# Patient Record
Sex: Female | Born: 1955
Health system: Southern US, Community
[De-identification: ages and names within clinical notes are randomized; demographics above are authoritative.]

## PROBLEM LIST (undated history)

## (undated) DIAGNOSIS — D219 Benign neoplasm of connective and other soft tissue, unspecified: Secondary | ICD-10-CM

## (undated) DIAGNOSIS — R87619 Unspecified abnormal cytological findings in specimens from cervix uteri: Secondary | ICD-10-CM

## (undated) DIAGNOSIS — M1990 Unspecified osteoarthritis, unspecified site: Secondary | ICD-10-CM

## (undated) DIAGNOSIS — M542 Cervicalgia: Secondary | ICD-10-CM

## (undated) DIAGNOSIS — T7840XA Allergy, unspecified, initial encounter: Secondary | ICD-10-CM

## (undated) DIAGNOSIS — E669 Obesity, unspecified: Secondary | ICD-10-CM

## (undated) DIAGNOSIS — R0683 Snoring: Secondary | ICD-10-CM

## (undated) DIAGNOSIS — K219 Gastro-esophageal reflux disease without esophagitis: Secondary | ICD-10-CM

## (undated) DIAGNOSIS — M549 Dorsalgia, unspecified: Secondary | ICD-10-CM

## (undated) DIAGNOSIS — Z973 Presence of spectacles and contact lenses: Secondary | ICD-10-CM

## (undated) HISTORY — DX: Cervicalgia: M54.2

## (undated) HISTORY — DX: Gastro-esophageal reflux disease without esophagitis: K21.9

## (undated) HISTORY — PX: COLONOSCOPY: SHX174

## (undated) HISTORY — PX: BREAST CYST EXCISION: SHX579

## (undated) HISTORY — DX: Obesity, unspecified: E66.9

## (undated) HISTORY — PX: BREAST BIOPSY: SHX20

## (undated) HISTORY — DX: Unspecified osteoarthritis, unspecified site: M19.90

## (undated) HISTORY — DX: Benign neoplasm of connective and other soft tissue, unspecified: D21.9

## (undated) HISTORY — PX: REDUCTION MAMMAPLASTY: SUR839

## (undated) HISTORY — DX: Unspecified abnormal cytological findings in specimens from cervix uteri: R87.619

## (undated) HISTORY — DX: Allergy, unspecified, initial encounter: T78.40XA

---

## 1974-04-25 HISTORY — PX: CHOLECYSTECTOMY: SHX55

## 2001-07-27 ENCOUNTER — Ambulatory Visit (HOSPITAL_COMMUNITY): Admission: RE | Admit: 2001-07-27 | Discharge: 2001-07-27 | Payer: Self-pay | Admitting: Family Medicine

## 2001-07-27 ENCOUNTER — Encounter: Payer: Self-pay | Admitting: Family Medicine

## 2001-10-01 ENCOUNTER — Ambulatory Visit (HOSPITAL_COMMUNITY): Admission: RE | Admit: 2001-10-01 | Discharge: 2001-10-01 | Payer: Self-pay | Admitting: Unknown Physician Specialty

## 2001-10-01 ENCOUNTER — Encounter: Payer: Self-pay | Admitting: Unknown Physician Specialty

## 2001-12-07 ENCOUNTER — Ambulatory Visit (HOSPITAL_COMMUNITY): Admission: RE | Admit: 2001-12-07 | Discharge: 2001-12-07 | Payer: Self-pay | Admitting: Unknown Physician Specialty

## 2001-12-07 ENCOUNTER — Encounter: Payer: Self-pay | Admitting: Unknown Physician Specialty

## 2001-12-22 ENCOUNTER — Emergency Department (HOSPITAL_COMMUNITY): Admission: EM | Admit: 2001-12-22 | Discharge: 2001-12-22 | Payer: Self-pay | Admitting: Internal Medicine

## 2002-01-07 ENCOUNTER — Ambulatory Visit (HOSPITAL_COMMUNITY): Admission: RE | Admit: 2002-01-07 | Discharge: 2002-01-07 | Payer: Self-pay | Admitting: Unknown Physician Specialty

## 2002-01-07 ENCOUNTER — Encounter: Payer: Self-pay | Admitting: Unknown Physician Specialty

## 2002-07-31 ENCOUNTER — Ambulatory Visit (HOSPITAL_COMMUNITY): Admission: RE | Admit: 2002-07-31 | Discharge: 2002-07-31 | Payer: Self-pay | Admitting: Family Medicine

## 2002-07-31 ENCOUNTER — Encounter: Payer: Self-pay | Admitting: Family Medicine

## 2003-04-22 ENCOUNTER — Ambulatory Visit (HOSPITAL_COMMUNITY): Admission: RE | Admit: 2003-04-22 | Discharge: 2003-04-22 | Payer: Self-pay | Admitting: Family Medicine

## 2003-11-20 ENCOUNTER — Ambulatory Visit (HOSPITAL_COMMUNITY): Admission: RE | Admit: 2003-11-20 | Discharge: 2003-11-20 | Payer: Self-pay | Admitting: Internal Medicine

## 2004-02-16 ENCOUNTER — Ambulatory Visit (HOSPITAL_COMMUNITY): Admission: RE | Admit: 2004-02-16 | Discharge: 2004-02-16 | Payer: Self-pay | Admitting: Obstetrics and Gynecology

## 2004-04-29 ENCOUNTER — Ambulatory Visit: Payer: Self-pay | Admitting: Family Medicine

## 2004-05-21 ENCOUNTER — Emergency Department (HOSPITAL_COMMUNITY): Admission: EM | Admit: 2004-05-21 | Discharge: 2004-05-21 | Payer: Self-pay | Admitting: Emergency Medicine

## 2005-01-30 ENCOUNTER — Emergency Department (HOSPITAL_COMMUNITY): Admission: EM | Admit: 2005-01-30 | Discharge: 2005-01-30 | Payer: Self-pay | Admitting: Emergency Medicine

## 2005-11-17 ENCOUNTER — Ambulatory Visit: Payer: Self-pay | Admitting: Obstetrics & Gynecology

## 2005-11-17 LAB — CONVERTED CEMR LAB: Pap Smear: NORMAL

## 2005-11-21 ENCOUNTER — Ambulatory Visit (HOSPITAL_COMMUNITY): Admission: RE | Admit: 2005-11-21 | Discharge: 2005-11-21 | Payer: Self-pay | Admitting: Family Medicine

## 2007-01-02 ENCOUNTER — Ambulatory Visit (HOSPITAL_COMMUNITY): Admission: RE | Admit: 2007-01-02 | Discharge: 2007-01-02 | Payer: Self-pay | Admitting: Emergency Medicine

## 2007-02-21 LAB — HM SIGMOIDOSCOPY: HM Sigmoidoscopy: NORMAL

## 2007-02-21 LAB — HM COLONOSCOPY: HM Colonoscopy: NORMAL

## 2007-06-14 ENCOUNTER — Encounter: Admission: RE | Admit: 2007-06-14 | Discharge: 2007-06-14 | Payer: Self-pay | Admitting: Emergency Medicine

## 2008-01-03 ENCOUNTER — Ambulatory Visit (HOSPITAL_COMMUNITY): Admission: RE | Admit: 2008-01-03 | Discharge: 2008-01-03 | Payer: Self-pay | Admitting: Emergency Medicine

## 2009-01-21 ENCOUNTER — Ambulatory Visit: Payer: Self-pay | Admitting: Family Medicine

## 2009-01-21 DIAGNOSIS — N950 Postmenopausal bleeding: Secondary | ICD-10-CM | POA: Insufficient documentation

## 2009-01-21 DIAGNOSIS — D259 Leiomyoma of uterus, unspecified: Secondary | ICD-10-CM | POA: Insufficient documentation

## 2009-01-21 DIAGNOSIS — R87619 Unspecified abnormal cytological findings in specimens from cervix uteri: Secondary | ICD-10-CM | POA: Insufficient documentation

## 2009-01-23 ENCOUNTER — Encounter: Payer: Self-pay | Admitting: Family Medicine

## 2009-01-23 LAB — CONVERTED CEMR LAB
BUN: 10 mg/dL (ref 6–23)
Basophils Absolute: 0 10*3/uL (ref 0.0–0.1)
Basophils Relative: 0 % (ref 0–1)
CO2: 23 meq/L (ref 19–32)
Calcium: 9.3 mg/dL (ref 8.4–10.5)
Chloride: 106 meq/L (ref 96–112)
Cholesterol: 176 mg/dL (ref 0–200)
Creatinine, Ser: 0.88 mg/dL (ref 0.40–1.20)
Eosinophils Absolute: 0.1 10*3/uL (ref 0.0–0.7)
Eosinophils Relative: 2 % (ref 0–5)
Glucose, Bld: 88 mg/dL (ref 70–99)
HCT: 40.4 % (ref 36.0–46.0)
HDL: 58 mg/dL (ref >39)
Hemoglobin: 12.9 g/dL (ref 12.0–15.0)
LDL Cholesterol: 102 mg/dL — ABNORMAL HIGH (ref 0–99)
Lymphocytes Relative: 42 % (ref 12–46)
Lymphs Abs: 2.1 10*3/uL (ref 0.7–4.0)
MCHC: 31.9 g/dL (ref 30.0–36.0)
MCV: 92.2 fL (ref 78.0–100.0)
Monocytes Absolute: 0.4 10*3/uL (ref 0.1–1.0)
Monocytes Relative: 7 % (ref 3–12)
Neutro Abs: 2.4 10*3/uL (ref 1.7–7.7)
Neutrophils Relative %: 48 % (ref 43–77)
Platelets: 260 10*3/uL (ref 150–400)
Potassium: 4.5 meq/L (ref 3.5–5.3)
RBC: 4.38 M/uL (ref 3.87–5.11)
RDW: 14.1 % (ref 11.5–15.5)
Sodium: 143 meq/L (ref 135–145)
Total CHOL/HDL Ratio: 3
Triglycerides: 78 mg/dL (ref <150)
VLDL: 16 mg/dL (ref 0–40)
WBC: 5 10*3/uL (ref 4.0–10.5)

## 2009-01-25 DIAGNOSIS — J309 Allergic rhinitis, unspecified: Secondary | ICD-10-CM | POA: Insufficient documentation

## 2009-01-26 ENCOUNTER — Encounter: Payer: Self-pay | Admitting: Family Medicine

## 2009-01-27 ENCOUNTER — Encounter: Payer: Self-pay | Admitting: Family Medicine

## 2009-01-27 LAB — CONVERTED CEMR LAB: TSH: 1.657 microintl units/mL (ref 0.350–4.500)

## 2009-02-27 ENCOUNTER — Emergency Department (HOSPITAL_COMMUNITY): Admission: EM | Admit: 2009-02-27 | Discharge: 2009-02-28 | Payer: Self-pay | Admitting: Emergency Medicine

## 2010-03-05 ENCOUNTER — Other Ambulatory Visit: Admission: RE | Admit: 2010-03-05 | Discharge: 2010-03-05 | Payer: Self-pay | Admitting: Family Medicine

## 2010-03-23 ENCOUNTER — Encounter: Admission: RE | Admit: 2010-03-23 | Discharge: 2010-03-23 | Payer: Self-pay | Admitting: Family Medicine

## 2010-05-16 ENCOUNTER — Encounter: Payer: Self-pay | Admitting: Family Medicine

## 2010-05-27 NOTE — Progress Notes (Signed)
Summary: OBGYN  OBGYN   Imported By: Lind Guest 01/27/2009 08:14:09  _____________________________________________________________________  External Attachment:    Type:   Image     Comment:   External Document

## 2010-07-19 ENCOUNTER — Ambulatory Visit (INDEPENDENT_AMBULATORY_CARE_PROVIDER_SITE_OTHER): Payer: BC Managed Care – PPO | Admitting: Internal Medicine

## 2010-07-19 ENCOUNTER — Encounter: Payer: Self-pay | Admitting: Internal Medicine

## 2010-07-19 VITALS — BP 110/72 | HR 76 | Ht 67.0 in | Wt 234.6 lb

## 2010-07-19 DIAGNOSIS — J209 Acute bronchitis, unspecified: Secondary | ICD-10-CM | POA: Insufficient documentation

## 2010-07-19 DIAGNOSIS — J309 Allergic rhinitis, unspecified: Secondary | ICD-10-CM

## 2010-07-19 MED ORDER — ALBUTEROL SULFATE HFA 108 (90 BASE) MCG/ACT IN AERS: 2.0000 | INHALATION_SPRAY | Freq: Four times a day (QID) | RESPIRATORY_TRACT | Status: DC | PRN

## 2010-07-19 MED ORDER — BECLOMETHASONE DIPROPIONATE 80 MCG/ACT IN AERS: 2.0000 | INHALATION_SPRAY | Freq: Two times a day (BID) | RESPIRATORY_TRACT | Status: DC

## 2010-07-19 MED ORDER — FLUTICASONE PROPIONATE 50 MCG/ACT NA SUSP: 2.0000 | Freq: Every day | NASAL | Status: DC

## 2010-07-19 NOTE — Assessment & Plan Note (Signed)
Mild interrmittent seasonal allergy.

## 2010-07-19 NOTE — Progress Notes (Signed)
Subjective:    Patient ID: Stephanie Wall, female    DOB: 01/15/1956, 55 y.o.   MRN: [id number] New Patient Visit- Asthma She complains of shortness of breath. Her past medical history is significant for asthma.  Shortness of Breath Her past medical history is significant for asthma.  HPI- 55 yoF never smoker self referred requesting pulmonary evaluation for recent dyspnea. Had allergic seasonal rhinitis in childhood. While in her 20's she would get an "allergy shot"- presume steroid- for Nasal congestion in Fall season. Again in last 2 years she has noted Spring and Fall sneezing and head congestion. In 2010 she went to ER with an asthmatic bronchitis syndrome after exposure to carpet cleaner. She has occasionally noted a little wheeze lying supine. Now in the last 3 weeks she has noted frontal headache, shortness of breath, wheeze, light headed, much cough, some nausea w/ vomiting or change in bowel/ bladder, no fever, purulent or swollen glands. Denies bleeding or weight loss She was walking regularly without dyspnea "till pollen", but says for past year she has noted more dyspnea on hills.  Denies hx diagnosed heart or lung disease.  Today she feels well.    Review of Systems  Respiratory: Positive for shortness of breath.   Constitutional:   No weight loss, night sweats,  Fevers, chills, fatigue, lassitude. HEENT:   Denies-,  Difficulty swallowing,  Tooth/dental problems,  Sore throat,               CV:  No chest pain,  Orthopnea, PND, swelling in lower extremities, anasarca, dizziness, palpitations  GI  No heartburn, occasional indigestion, abdominal pain, nausea, vomiting, diarrhea, change in bowel habits, loss of appetite  Resp: No shortness of breath with exertion or at rest.  No excess mucus, no productive cough,  No non-productive cough,  No coughing up of blood.  No change in color of mucus.  No wheezing.  No chest wall deformity  Skin: no rash or lesions.  GU: no dysuria,  change in color of urine, no urgency or frequency.  No flank pain.  MS:  No joint pain or swelling.  No decreased range of motion.  No back pain.  Psych:  No change in mood or affect. No depression or anxiety.  No memory loss.      Objective:   Physical Exam    General- Alert, Oriented, Affect-appropriate, Distress- none acute  Skin- rash-none, lesions- none, excoriation- none  Lymphadenopathy- none  Head- atraumatic  Eyes- Gross vision intact, PERRLA, conjunctivae clear, secretions  Ears- Normal- Hearing, canals, Tm L ,   R ,  Nose- Clear, No- Septal dev, mucus, polyps, erosion, perforation   Throat- Mallampati II , mucosa clear , drainage- none, tonsils- atrophic  Neck- flexible , trachea midline, no stridor , thyroid nl, carotid no bruit  Chest - symmetrical excursion , unlabored     Heart/CV- RRR , no murmur , no gallop  , no rub, nl s1 s2                     - JVD- none , edema- none, stasis changes- none, varices- none     Lung- clear to P&A, wheeze- none, cough- none , dullness-none, rub- none     Chest wall-  Abd- tender-no, distended-no, bowel sounds-present, HSM- no  Br/ Gen/ Rectal- Not done, not indicated  Extrem- cyanosis- none, clubbing, none, atrophy- none, strength- nl  Neuro- grossly intact to observation     Assessment &  Plan:

## 2010-07-19 NOTE — Assessment & Plan Note (Addendum)
Acute URI with bronchitis syndrome in late February/ early March, 2012.  This timing is nonspecific, but would first question a viral infection rather than pollen allergy alone, although she could have had both. She has had occasional CXR. We will get PFT for baseline, since she currently feels well.

## 2010-07-19 NOTE — Patient Instructions (Addendum)
Order- Schedule PFT  Meds-  Qvar 80- maintenance controller. 2 puffs and rinse mouth, twice every day. You can try to stay off of this if you are doing really well.   Ventolin HFA- rescue bronchodilator inhaler- 2 puffs, up to 4 times daily when needed.

## 2010-07-23 ENCOUNTER — Ambulatory Visit (INDEPENDENT_AMBULATORY_CARE_PROVIDER_SITE_OTHER): Payer: BC Managed Care – PPO | Admitting: Internal Medicine

## 2010-07-23 DIAGNOSIS — R0989 Other specified symptoms and signs involving the circulatory and respiratory systems: Secondary | ICD-10-CM

## 2010-07-23 DIAGNOSIS — R0600 Dyspnea, unspecified: Secondary | ICD-10-CM

## 2010-07-23 DIAGNOSIS — R0609 Other forms of dyspnea: Secondary | ICD-10-CM

## 2010-07-23 LAB — PULMONARY FUNCTION TEST

## 2010-07-23 NOTE — Progress Notes (Signed)
PFT done today. 

## 2010-07-28 LAB — GLUCOSE, CAPILLARY: Glucose-Capillary: 111 mg/dL — ABNORMAL HIGH (ref 70–99)

## 2010-08-02 ENCOUNTER — Encounter: Payer: Self-pay | Admitting: Internal Medicine

## 2010-08-20 ENCOUNTER — Institutional Professional Consult (permissible substitution): Payer: Self-pay | Admitting: Internal Medicine

## 2010-09-10 NOTE — Op Note (Signed)
NAME:  Stephanie Wall, Stephanie Wall                       ACCOUNT NO.:  [id number]   MEDICAL RECORD NO.:  [id number]                   PATIENT TYPE:  AMB   LOCATION:  DAY                                  FACILITY:  [facility]   PHYSICIAN:  R. Roetta Sessions, M.D.              DATE OF BIRTH:  1955/11/25   DATE OF PROCEDURE:  11/20/2003  DATE OF DISCHARGE:                                 OPERATIVE REPORT   PROCEDURE:  Colonoscopy with snare polypectomy.   INDICATIONS FOR PROCEDURE:  The patient is a 55 year old lady sent over by  the courtesy of Dr. Syliva Overman for colorectal cancer screening.  She  is devoid of any lower GI tract symptoms.  There is no family history of  colorectal neoplasia.  She has never had her colon imaged previously.  Colonoscopy is now being done as a standard screening maneuver.  This  approach has been discussed with the patient at length.  The potential  risks, benefits, and alternatives have been reviewed and questions answered.  She is agreeable.  Please see the documentation in the medical record for  more information.   PROCEDURE:  O2 saturation, blood pressure, pulses, and respirations were  monitored throughout the entirety of the procedure.  Conscious sedation was  with Versed 3 mg IV, Demerol 75 mg IV in divided doses.  The instrument used  was the Olympus video chip system.   FINDINGS:  Digital rectal examination revealed no abnormalities.   ENDOSCOPIC FINDINGS:  The prep was good.   Rectum:  Examination of the rectal mucosa including retroflex view of the  anal verge revealed no abnormalities.   Colon:  The colonic mucosa was surveyed from the rectosigmoid junction  through the left, transverse, right colon to the area of the appendiceal  orifice, ileocecal valve, and cecum.  These structures were well-seen and  photographed for the record.  From this level, the scope was slowly  withdrawn.  All previously mentioned mucosal surfaces were again seen.  The  patient was noted to have a 1.25-cm angry pedunculated polyp at the splenic  flexure.  It was removed with snare cautery.  It was recovered through the  scope.  The patient tolerated the procedure well and was reactive in  endoscopy.   IMPRESSION:  1. Normal rectum.  2. Angry pedunculated polyp at the splenic flexure, as described above,     status post snare polypectomy.  3. The remainder of the colonic mucosa appeared normal.   RECOMMENDATIONS:  1. No aspirin or arthritis medications for the next 10 days.  2. Follow up on pathology.  3. Further recommendations to follow.      ___________________________________________                                            Suszanne Conners  Rourk, M.D.   RMR/MEDQ  D:  11/20/2003  T:  11/20/2003  Job:  [id number]   cc:   Milus Mallick. Lodema Hong, M.D.  [address]  [place], [place] [address]  Fax: [phone]

## 2010-09-10 NOTE — Group Therapy Note (Signed)
NAMENORAA, PICKERAL NO.:  [id number]   MEDICAL RECORD NO.:  [id number]          PATIENT TYPE:  WOC   LOCATION:  [facility]                   FACILITY:  [facility]   PHYSICIAN:  Dorthula Perfect, MD     DATE OF BIRTH:  [birth year]   DATE OF SERVICE:  11/17/2005                                    CLINIC NOTE   A 55 year old black female gravida 2, para 2, last menstrual period 2004, is  here for yearly exam and Pap smear.  Since she stopped having her periods in  2004 she has had no vaginal bleeding or spotting.  She is having some hot  flashes.  She does have some vaginal dryness.   PAST MEDICAL HISTORY:  Operations:  Cholecystectomy and breast cyst.   MEDICATIONS:  None.   ALLERGIES:  None.   FAMILY HISTORY:  Breast cancer, a maternal grandmother.  No family history  of ovarian or colon cancer.   Last mammogram was 2004.  She has no separate cardiovascular, respiratory,  GI, or GU complaints.   PHYSICAL EXAMINATION:  VITAL SIGNS:  Blood pressure 122/84, weight 233  pounds.  BREAST:  Large pendulous breasts are normal.  ABDOMEN:  Obese.  No abdominal masses are noted.  PELVIC:  External genitalia and BUS glands are normal.  Vaginal vault was  epithelialized as was the cervix.  There was slight cervical stenosis.  Uterus is in the midline and is of normal size and shape.  Adnexal areas  were normal.  The ovaries were not palpable.   IMPRESSION:  Normal gynecologic examination.   DISPOSITION:  1.  Pap smear.  2.  The patient will be scheduled to have a mammogram.  3.  She will use either K-Y Jelly or Replens to improve vaginal moisture at      the time of sexual intercourse.           ______________________________  Dorthula Perfect, MD     ER/MEDQ  D:  11/17/2005  T:  11/17/2005  Job:  [id number]

## 2010-09-13 ENCOUNTER — Encounter: Payer: Self-pay | Admitting: Internal Medicine

## 2010-09-21 ENCOUNTER — Ambulatory Visit: Payer: BC Managed Care – PPO | Admitting: Internal Medicine

## 2011-04-22 ENCOUNTER — Other Ambulatory Visit: Payer: Self-pay | Admitting: Family Medicine

## 2011-04-22 DIAGNOSIS — Z1231 Encounter for screening mammogram for malignant neoplasm of breast: Secondary | ICD-10-CM

## 2011-05-18 ENCOUNTER — Ambulatory Visit
Admission: RE | Admit: 2011-05-18 | Discharge: 2011-05-18 | Disposition: A | Discharge status: Home / Self Care | Payer: BC Managed Care – PPO | Source: Ambulatory Visit | Attending: Family Medicine | Admitting: Family Medicine

## 2011-05-18 DIAGNOSIS — Z1231 Encounter for screening mammogram for malignant neoplasm of breast: Secondary | ICD-10-CM

## 2011-06-01 ENCOUNTER — Other Ambulatory Visit: Payer: Self-pay | Admitting: Obstetrics and Gynecology

## 2011-06-01 ENCOUNTER — Other Ambulatory Visit (HOSPITAL_COMMUNITY)
Admission: RE | Admit: 2011-06-01 | Discharge: 2011-06-01 | Disposition: A | Discharge status: Home / Self Care | Payer: BC Managed Care – PPO | Source: Ambulatory Visit | Attending: Obstetrics and Gynecology | Admitting: Obstetrics and Gynecology

## 2011-06-01 DIAGNOSIS — Z01419 Encounter for gynecological examination (general) (routine) without abnormal findings: Secondary | ICD-10-CM | POA: Insufficient documentation

## 2011-07-24 ENCOUNTER — Emergency Department (HOSPITAL_COMMUNITY)
Admission: EM | Admit: 2011-07-24 | Discharge: 2011-07-24 | Disposition: A | Discharge status: Home / Self Care | Payer: BC Managed Care – PPO | Attending: Emergency Medicine | Admitting: Emergency Medicine

## 2011-07-24 ENCOUNTER — Encounter (HOSPITAL_COMMUNITY): Payer: Self-pay | Admitting: *Deleted

## 2011-07-24 DIAGNOSIS — G8929 Other chronic pain: Secondary | ICD-10-CM

## 2011-07-24 DIAGNOSIS — M549 Dorsalgia, unspecified: Secondary | ICD-10-CM | POA: Insufficient documentation

## 2011-07-24 MED ORDER — ONDANSETRON 4 MG PO TBDP
4.0000 mg | ORAL_TABLET | Freq: Once | ORAL | Status: AC
  Administered 2011-07-24: 4 mg via ORAL
  Filled 2011-07-24: qty 1

## 2011-07-24 MED ORDER — HYDROMORPHONE HCL 2 MG PO TABS: 2.0000 mg | ORAL_TABLET | ORAL | Status: AC | PRN

## 2011-07-24 MED ORDER — HYDROMORPHONE HCL PF 2 MG/ML IJ SOLN
2.0000 mg | Freq: Once | INTRAMUSCULAR | Status: AC
  Administered 2011-07-24: 2 mg via INTRAMUSCULAR
  Filled 2011-07-24: qty 1

## 2011-07-24 MED ORDER — METOCLOPRAMIDE HCL 10 MG PO TABS
10.0000 mg | ORAL_TABLET | ORAL | Status: AC
  Administered 2011-07-24: 10 mg via ORAL
  Filled 2011-07-24: qty 1

## 2011-07-24 NOTE — ED Notes (Signed)
Pt states that she is now itching "everywhere her clothes are." No skin issues noted at this time. Pt states she is still having trouble swallowing. Speech is clear and Airway patent. No labored breathing noted.

## 2011-07-24 NOTE — ED Notes (Signed)
I gave the patient a warm blanket. 

## 2011-07-24 NOTE — ED Notes (Signed)
The pt has had chronic back pain but yesterday she tripped and fell.  Now she has neck and pain in her entire back with a headache

## 2011-07-24 NOTE — Discharge Instructions (Signed)
Use ibuprofen 600 mg every 6 hours for pain.  Use Dilaudid for more severe pain.  Followup with the internal medicine doctor or Dr. Jule Ser for reevaluation. Return for worse or uncontrolled symptoms.

## 2011-07-24 NOTE — ED Notes (Signed)
While bringing pt to d/c window, pt stated that she was having trouble swallowing. NAD noted by this RN or MD but will keep pt to monitor and observe until pt is comfortable leaving ED. Placed on pulse ox monitor. No airway obstruction noted or oral swelling.

## 2011-07-24 NOTE — ED Notes (Signed)
Pt is able to drink water with no issues seen by this RN. No resp distress noted. O2 sats 98% on RA. Pt states "I feel drunk." Informed on strength of medication and that it is a common side effect along with itching.

## 2011-07-24 NOTE — ED Notes (Signed)
Dr. Salena Saner wrote for different prescription to control pain as pt believes it was dilaudid that caused reaction.

## 2011-07-24 NOTE — ED Provider Notes (Signed)
History     CSN: [id number]  Arrival date & time 07/24/11  Oct 18, 1626   First MD Initiated Contact with Patient 07/24/11 1726      Chief Complaint  Patient presents with  . Back Pain    (Consider location/radiation/quality/duration/timing/severity/associated sxs/prior treatment) Patient is a 56 y.o. female presenting with back pain. The history is provided by the patient and the spouse.  Back Pain  Associated symptoms include headaches. Pertinent negatives include no chest pain, no fever, no abdominal pain, no dysuria and no weakness.  the pt is a 56 year old, female, with scoliosis and chronic back pain.  Sometimes, the the pain becomes so severe that makes her stumble and fall.  This occurred yesterday.  She did not fall completely to the ground, but she caught herself.  Now.  She complains of increased pain in her back.  She denies weakness in her arms or legs.  She denies fevers, chills, nausea, vomiting, or urinary tract symptoms.  She took Tylenol, but did not get relief, so she came to the emergency department.  Her husband asks whether or not on MRI can be performed.  I informed him and the patient at it was not indicated at this time.  Because she has no bowel or bladder incontinence or weakness  Past Medical History  Diagnosis Date  . Abnormal glandular Papanicolaou smear of cervix   . Cervicalgia   . Obesity, unspecified   . Fibroids     uterus  . Asthma   . ALLERGIC RHINITIS     Past Surgical History  Procedure Date  . Cholecystectomy 1976  . Breast cyst excision 1984 and 1997    Family History  Problem Relation Age of Onset  . Hypertension Mother   . Hypertension Father   . HIV      AIDS deceased age 28 in 10-18-07.    History  Substance Use Topics  . Smoking status: Never Smoker   . Smokeless tobacco: Not on file  . Alcohol Use: No    OB History    Grav Para Term Preterm Abortions TAB SAB Ect Mult Living                  Review of Systems    Constitutional: Negative for fever and chills.  Cardiovascular: Negative for chest pain.  Gastrointestinal: Negative for nausea, vomiting and abdominal pain.  Genitourinary: Negative for dysuria and hematuria.  Musculoskeletal: Positive for back pain.  Neurological: Positive for headaches. Negative for weakness.  Psychiatric/Behavioral: Negative for confusion.  All other systems reviewed and are negative.    Allergies  Demerol  Home Medications   Current Outpatient Rx  Name Route Sig Dispense Refill  . ACETAMINOPHEN 500 MG PO TABS Oral Take 1,000 mg by mouth every 6 (six) hours as needed. For pain    . BECLOMETHASONE DIPROPIONATE 80 MCG/ACT IN AERS Inhalation Inhale 2 puffs into the lungs 2 (two) times daily. Rinse mouth 1 Inhaler prn  . FLUTICASONE PROPIONATE 50 MCG/ACT NA SUSP Nasal 2 sprays by Nasal route daily. 2 sprays each nostril once daily 16 g prn  . LORATADINE 10 MG PO CAPS Oral Take 1 capsule by mouth daily.      . MELOXICAM 15 MG PO TABS Oral Take 15 mg by mouth every evening.    . ALBUTEROL SULFATE HFA 108 (90 BASE) MCG/ACT IN AERS Inhalation Inhale 2 puffs into the lungs every 6 (six) hours as needed for wheezing or shortness of breath (Rescue inhaler- if  needed). 1 Inhaler prn    BP 136/77  Pulse 70  Temp(Src) 97.7 F (36.5 C) (Oral)  Resp 20  SpO2 99%  Physical Exam  Vitals reviewed. Constitutional: She is oriented to person, place, and time. She appears well-developed and well-nourished. No distress.  HENT:  Head: Normocephalic and atraumatic.  Eyes: EOM are normal.  Neck: Normal range of motion. Neck supple.       No cervical tenderness  Cardiovascular: Normal rate.   No murmur heard. Pulmonary/Chest: Effort normal. No respiratory distress.  Musculoskeletal: Normal range of motion.       No thoracic or lumbar spine tenderness.  However, she does have tenderness on the left parathoracic back area, as well as the right.  Lumbar area  Neurological: She  is alert and oriented to person, place, and time. No cranial nerve deficit.       Motor strength in upper extremities is normal  Skin: Skin is warm and dry.  Psychiatric: She has a normal mood and affect. Thought content normal.    ED Course  Procedures (including critical care time) Chronic back pain with increase after stumbling yesterday.  No impact with the fall.  There are no systemic symptoms.  There is no bowel or bladder incontinence and there is no evidence of muscular weakness.  So.  There is no indication for laboratory testing or MRI at this time.  Labs Reviewed - No data to display No results found.   No diagnosis found.    MDM  Chronic back pain with increase after stumbling yesterday.  No evidence of systemic illness or neurological deficit or limb, weakness        Cheri Guppy, MD 07/24/11 1827

## 2011-07-24 NOTE — ED Notes (Signed)
Pt states that the pain shoots up into her head and down extremities. Pt also feels numbness in left leg.

## 2011-08-12 ENCOUNTER — Other Ambulatory Visit: Payer: Self-pay | Admitting: Family Medicine

## 2011-08-12 ENCOUNTER — Ambulatory Visit
Admission: RE | Admit: 2011-08-12 | Discharge: 2011-08-12 | Disposition: A | Discharge status: Home / Self Care | Payer: BC Managed Care – PPO | Source: Ambulatory Visit | Attending: Family Medicine | Admitting: Family Medicine

## 2011-08-12 DIAGNOSIS — M1250 Traumatic arthropathy, unspecified site: Secondary | ICD-10-CM

## 2012-02-17 ENCOUNTER — Other Ambulatory Visit: Payer: Self-pay | Admitting: Internal Medicine

## 2012-06-06 ENCOUNTER — Other Ambulatory Visit: Payer: Self-pay | Admitting: Family Medicine

## 2012-06-06 DIAGNOSIS — Z1231 Encounter for screening mammogram for malignant neoplasm of breast: Secondary | ICD-10-CM

## 2012-06-27 ENCOUNTER — Other Ambulatory Visit (HOSPITAL_COMMUNITY)
Admission: RE | Admit: 2012-06-27 | Discharge: 2012-06-27 | Disposition: A | Discharge status: Home / Self Care | Payer: BC Managed Care – PPO | Source: Ambulatory Visit | Attending: Obstetrics and Gynecology | Admitting: Obstetrics and Gynecology

## 2012-06-27 ENCOUNTER — Other Ambulatory Visit: Payer: Self-pay | Admitting: Obstetrics and Gynecology

## 2012-06-27 DIAGNOSIS — Z1151 Encounter for screening for human papillomavirus (HPV): Secondary | ICD-10-CM | POA: Insufficient documentation

## 2012-06-27 DIAGNOSIS — Z01419 Encounter for gynecological examination (general) (routine) without abnormal findings: Secondary | ICD-10-CM | POA: Insufficient documentation

## 2012-07-03 ENCOUNTER — Ambulatory Visit
Admission: RE | Admit: 2012-07-03 | Discharge: 2012-07-03 | Disposition: A | Discharge status: Home / Self Care | Payer: BC Managed Care – PPO | Source: Ambulatory Visit | Attending: Family Medicine | Admitting: Family Medicine

## 2012-07-03 DIAGNOSIS — Z1231 Encounter for screening mammogram for malignant neoplasm of breast: Secondary | ICD-10-CM

## 2012-07-13 ENCOUNTER — Encounter (HOSPITAL_BASED_OUTPATIENT_CLINIC_OR_DEPARTMENT_OTHER): Payer: Self-pay | Admitting: *Deleted

## 2012-07-13 NOTE — Progress Notes (Signed)
Denies any heart problems-only asthma-controlled now

## 2012-07-16 ENCOUNTER — Encounter (HOSPITAL_BASED_OUTPATIENT_CLINIC_OR_DEPARTMENT_OTHER): Payer: Self-pay

## 2012-07-16 ENCOUNTER — Ambulatory Visit (HOSPITAL_BASED_OUTPATIENT_CLINIC_OR_DEPARTMENT_OTHER)
Admission: RE | Admit: 2012-07-16 | Discharge: 2012-07-16 | Disposition: A | Discharge status: Home / Self Care | Payer: BC Managed Care – PPO | Source: Ambulatory Visit | Attending: Plastic Surgery | Admitting: Plastic Surgery

## 2012-07-16 ENCOUNTER — Encounter (HOSPITAL_BASED_OUTPATIENT_CLINIC_OR_DEPARTMENT_OTHER)
Admission: RE | Disposition: A | Discharge status: Home / Self Care | Payer: Self-pay | Source: Ambulatory Visit | Attending: Plastic Surgery

## 2012-07-16 ENCOUNTER — Ambulatory Visit (HOSPITAL_BASED_OUTPATIENT_CLINIC_OR_DEPARTMENT_OTHER): Payer: BC Managed Care – PPO | Admitting: Certified Registered Nurse Anesthetist

## 2012-07-16 ENCOUNTER — Encounter (HOSPITAL_BASED_OUTPATIENT_CLINIC_OR_DEPARTMENT_OTHER): Payer: Self-pay | Admitting: Certified Registered Nurse Anesthetist

## 2012-07-16 DIAGNOSIS — J45909 Unspecified asthma, uncomplicated: Secondary | ICD-10-CM | POA: Insufficient documentation

## 2012-07-16 DIAGNOSIS — N6019 Diffuse cystic mastopathy of unspecified breast: Secondary | ICD-10-CM | POA: Insufficient documentation

## 2012-07-16 DIAGNOSIS — N62 Hypertrophy of breast: Secondary | ICD-10-CM | POA: Insufficient documentation

## 2012-07-16 DIAGNOSIS — M25519 Pain in unspecified shoulder: Secondary | ICD-10-CM | POA: Insufficient documentation

## 2012-07-16 DIAGNOSIS — M549 Dorsalgia, unspecified: Secondary | ICD-10-CM | POA: Insufficient documentation

## 2012-07-16 HISTORY — DX: Presence of spectacles and contact lenses: Z97.3

## 2012-07-16 HISTORY — PX: BREAST REDUCTION SURGERY: SHX8

## 2012-07-16 HISTORY — DX: Snoring: R06.83

## 2012-07-16 HISTORY — DX: Dorsalgia, unspecified: M54.9

## 2012-07-16 LAB — POCT HEMOGLOBIN-HEMACUE: Hemoglobin: 13.5 g/dL (ref 12.0–15.0)

## 2012-07-16 SURGERY — MAMMOPLASTY, REDUCTION
Anesthesia: General | Site: Breast | Laterality: Bilateral | Wound class: Clean

## 2012-07-16 MED ORDER — BACITRACIN ZINC 500 UNIT/GM EX OINT
TOPICAL_OINTMENT | CUTANEOUS | Status: DC | PRN
  Administered 2012-07-16: 1 via TOPICAL

## 2012-07-16 MED ORDER — DEXAMETHASONE SODIUM PHOSPHATE 4 MG/ML IJ SOLN
INTRAMUSCULAR | Status: DC | PRN
  Administered 2012-07-16: 10 mg via INTRAVENOUS
  Administered 2012-07-16: 4 mg via INTRAVENOUS

## 2012-07-16 MED ORDER — MIDAZOLAM HCL 2 MG/2ML IJ SOLN: 1.0000 mg | INTRAMUSCULAR | Status: DC | PRN

## 2012-07-16 MED ORDER — ONDANSETRON HCL 4 MG/2ML IJ SOLN
INTRAMUSCULAR | Status: DC | PRN
  Administered 2012-07-16: 4 mg via INTRAVENOUS

## 2012-07-16 MED ORDER — PROPOFOL 10 MG/ML IV BOLUS
INTRAVENOUS | Status: DC | PRN
  Administered 2012-07-16: 200 mg via INTRAVENOUS

## 2012-07-16 MED ORDER — OXYCODONE HCL 5 MG/5ML PO SOLN: 5.0000 mg | Freq: Once | ORAL | Status: AC | PRN

## 2012-07-16 MED ORDER — FENTANYL CITRATE 0.05 MG/ML IJ SOLN: 50.0000 ug | INTRAMUSCULAR | Status: DC | PRN

## 2012-07-16 MED ORDER — SUCCINYLCHOLINE CHLORIDE 20 MG/ML IJ SOLN
INTRAMUSCULAR | Status: DC | PRN
  Administered 2012-07-16: 100 mg via INTRAVENOUS

## 2012-07-16 MED ORDER — SODIUM CHLORIDE 0.9 % IJ SOLN
INTRAMUSCULAR | Status: DC | PRN
  Administered 2012-07-16 (×4)

## 2012-07-16 MED ORDER — MIDAZOLAM HCL 5 MG/5ML IJ SOLN
INTRAMUSCULAR | Status: DC | PRN
  Administered 2012-07-16: 2 mg via INTRAVENOUS

## 2012-07-16 MED ORDER — LIDOCAINE HCL (CARDIAC) 20 MG/ML IV SOLN
INTRAVENOUS | Status: DC | PRN
  Administered 2012-07-16: 80 mg via INTRAVENOUS

## 2012-07-16 MED ORDER — EPHEDRINE SULFATE 50 MG/ML IJ SOLN
INTRAMUSCULAR | Status: DC | PRN
  Administered 2012-07-16: 5 mg via INTRAVENOUS

## 2012-07-16 MED ORDER — LACTATED RINGERS IV SOLN
INTRAVENOUS | Status: DC
  Administered 2012-07-16 (×3): via INTRAVENOUS
  Administered 2012-07-16: 20 mL/h via INTRAVENOUS

## 2012-07-16 MED ORDER — SODIUM CHLORIDE 0.9 % IV SOLN: INTRAVENOUS | Status: DC | PRN

## 2012-07-16 MED ORDER — FENTANYL CITRATE 0.05 MG/ML IJ SOLN
25.0000 ug | INTRAMUSCULAR | Status: AC | PRN
  Administered 2012-07-16 (×4): 25 ug via INTRAVENOUS

## 2012-07-16 MED ORDER — FENTANYL CITRATE 0.05 MG/ML IJ SOLN
INTRAMUSCULAR | Status: DC | PRN
  Administered 2012-07-16: 100 ug via INTRAVENOUS
  Administered 2012-07-16 (×4): 50 ug via INTRAVENOUS
  Administered 2012-07-16: 100 ug via INTRAVENOUS

## 2012-07-16 MED ORDER — ONDANSETRON HCL 4 MG/2ML IJ SOLN: 4.0000 mg | Freq: Once | INTRAMUSCULAR | Status: DC | PRN

## 2012-07-16 MED ORDER — ACETAMINOPHEN 10 MG/ML IV SOLN
INTRAVENOUS | Status: DC | PRN
  Administered 2012-07-16: 1000 mg via INTRAVENOUS

## 2012-07-16 MED ORDER — CEFAZOLIN SODIUM 1-5 GM-% IV SOLN
1.0000 g | Freq: Once | INTRAVENOUS | Status: AC
  Administered 2012-07-16: 2 g via INTRAVENOUS

## 2012-07-16 MED ORDER — LIDOCAINE HCL (CARDIAC) 20 MG/ML IV SOLN: INTRAVENOUS | Status: DC | PRN

## 2012-07-16 MED ORDER — DIPHENHYDRAMINE HCL 50 MG/ML IJ SOLN
12.5000 mg | Freq: Once | INTRAMUSCULAR | Status: AC
  Administered 2012-07-16: 12.5 mg via INTRAVENOUS

## 2012-07-16 MED ORDER — OXYCODONE HCL 5 MG PO TABS
5.0000 mg | ORAL_TABLET | Freq: Once | ORAL | Status: AC | PRN
  Administered 2012-07-16: 5 mg via ORAL

## 2012-07-16 MED ORDER — LIDOCAINE-EPINEPHRINE 1 %-1:100000 IJ SOLN
INTRAMUSCULAR | Status: DC | PRN
  Administered 2012-07-16: 20 mL

## 2012-07-16 SURGICAL SUPPLY — 63 items
APL SKNCLS STERI-STRIP NONHPOA (GAUZE/BANDAGES/DRESSINGS) ×2
BAG DECANTER FOR FLEXI CONT (MISCELLANEOUS) ×2 IMPLANT
BANDAGE GAUZE ELAST BULKY 4 IN (GAUZE/BANDAGES/DRESSINGS) ×4 IMPLANT
BENZOIN TINCTURE PRP APPL 2/3 (GAUZE/BANDAGES/DRESSINGS) ×4 IMPLANT
BLADE KNIFE PERSONA 10 (BLADE) ×9 IMPLANT
BLADE KNIFE PERSONA 15 (BLADE) ×6 IMPLANT
CANISTER SUCTION 1200CC (MISCELLANEOUS) ×2 IMPLANT
CAP BOUFFANT 24 BLUE NURSES (PROTECTIVE WEAR) ×2 IMPLANT
CLOTH BEACON ORANGE TIMEOUT ST (SAFETY) ×2 IMPLANT
COVER MAYO STAND STRL (DRAPES) ×2 IMPLANT
COVER TABLE BACK 60X90 (DRAPES) ×2 IMPLANT
DECANTER SPIKE VIAL GLASS SM (MISCELLANEOUS) ×3 IMPLANT
DRAIN CHANNEL 10F 3/8 F FF (DRAIN) ×4 IMPLANT
DRAPE LAPAROSCOPIC ABDOMINAL (DRAPES) ×2 IMPLANT
DRSG EMULSION OIL 3X3 NADH (GAUZE/BANDAGES/DRESSINGS) ×4 IMPLANT
DRSG PAD ABDOMINAL 8X10 ST (GAUZE/BANDAGES/DRESSINGS) ×4 IMPLANT
ELECT NDL TIP 2.8 STRL (NEEDLE) IMPLANT
ELECT NEEDLE TIP 2.8 STRL (NEEDLE) IMPLANT
ELECT REM PT RETURN 9FT ADLT (ELECTROSURGICAL) ×2
ELECTRODE REM PT RTRN 9FT ADLT (ELECTROSURGICAL) ×1 IMPLANT
EVACUATOR SILICONE 100CC (DRAIN) ×4 IMPLANT
FILTER 7/8 IN (FILTER) ×2 IMPLANT
GLOVE BIO SURGEON STRL SZ 6.5 (GLOVE) ×1 IMPLANT
GLOVE BIO SURGEON STRL SZ7 (GLOVE) ×2 IMPLANT
GLOVE BIOGEL M STRL SZ7.5 (GLOVE) ×3 IMPLANT
GLOVE BIOGEL PI IND STRL 7.5 (GLOVE) IMPLANT
GLOVE BIOGEL PI INDICATOR 7.5 (GLOVE) ×3
GLOVE ECLIPSE 6.5 STRL STRAW (GLOVE) ×6 IMPLANT
GOWN PREVENTION PLUS XLARGE (GOWN DISPOSABLE) ×7 IMPLANT
NDL HYPO 25X1 1.5 SAFETY (NEEDLE) ×1 IMPLANT
NDL SPNL 18GX3.5 QUINCKE PK (NEEDLE) ×1 IMPLANT
NEEDLE HYPO 25X1 1.5 SAFETY (NEEDLE) ×4 IMPLANT
NEEDLE SPNL 18GX3.5 QUINCKE PK (NEEDLE) ×2 IMPLANT
NS IRRIG 1000ML POUR BTL (IV SOLUTION) ×5 IMPLANT
PACK BASIN DAY SURGERY FS (CUSTOM PROCEDURE TRAY) ×2 IMPLANT
PIN SAFETY STERILE (MISCELLANEOUS) ×2 IMPLANT
SCRUB PCMX 4 OZ (MISCELLANEOUS) ×1 IMPLANT
SCRUB TECHNI CARE SURGICAL (MISCELLANEOUS) ×1 IMPLANT
SLEEVE SCD COMPRESS KNEE MED (MISCELLANEOUS) ×2 IMPLANT
SLEEVE SURGEON STRL (DRAPES) ×1 IMPLANT
SPECIMEN JAR MEDIUM (MISCELLANEOUS) IMPLANT
SPECIMEN JAR X LARGE (MISCELLANEOUS) ×4 IMPLANT
SPONGE GAUZE 4X4 12PLY (GAUZE/BANDAGES/DRESSINGS) ×4 IMPLANT
SPONGE LAP 18X18 X RAY DECT (DISPOSABLE) ×7 IMPLANT
STAPLER VISISTAT 35W (STAPLE) ×4 IMPLANT
STRIP CLOSURE SKIN 1/2X4 (GAUZE/BANDAGES/DRESSINGS) ×9 IMPLANT
SUT ETHILON 3 0 PS 1 (SUTURE) ×2 IMPLANT
SUT MNCRL AB 3-0 PS2 18 (SUTURE) ×10 IMPLANT
SUT MNCRL AB 4-0 PS2 18 (SUTURE) ×4 IMPLANT
SUT MON AB 5-0 PS2 18 (SUTURE) ×4 IMPLANT
SUT PROLENE 2 0 CT2 30 (SUTURE) ×2 IMPLANT
SUT PROLENE 3 0 PS 1 (SUTURE) ×4 IMPLANT
SUT QUILL PDO 2-0 (SUTURE) ×4 IMPLANT
SYR BULB IRRIGATION 50ML (SYRINGE) ×4 IMPLANT
SYR CONTROL 10ML LL (SYRINGE) ×6 IMPLANT
TOWEL OR 17X24 6PK STRL BLUE (TOWEL DISPOSABLE) ×6 IMPLANT
TOWEL OR NON WOVEN STRL DISP B (DISPOSABLE) ×2 IMPLANT
TRAY DSU PREP LF (CUSTOM PROCEDURE TRAY) ×2 IMPLANT
TRAY FOLEY CATH 14FR (SET/KITS/TRAYS/PACK) ×2 IMPLANT
TUBE CONNECTING 20X1/4 (TUBING) ×2 IMPLANT
UNDERPAD 30X30 INCONTINENT (UNDERPADS AND DIAPERS) ×4 IMPLANT
VAC PENCILS W/TUBING CLEAR (MISCELLANEOUS) ×2 IMPLANT
YANKAUER SUCT BULB TIP NO VENT (SUCTIONS) ×2 IMPLANT

## 2012-07-16 NOTE — Anesthesia Procedure Notes (Addendum)
Performed by: Verlan Friends    Procedure Name: Intubation Date/Time: 07/16/2012 10:22 AM Performed by: Verlan Friends Pre-anesthesia Checklist: Patient identified, Emergency Drugs available, Suction available, Patient being monitored and Timeout performed Patient Re-evaluated:Patient Re-evaluated prior to inductionOxygen Delivery Method: Circle System Utilized Preoxygenation: Pre-oxygenation with 100% oxygen Intubation Type: IV induction Ventilation: Mask ventilation without difficulty Laryngoscope Size: Miller and 3 Grade View: Grade I Tube type: Oral Tube size: 7.0 mm Number of attempts: 1 Airway Equipment and Method: stylet and oral airway Placement Confirmation: ETT inserted through vocal cords under direct vision,  positive ETCO2 and breath sounds checked- equal and bilateral Tube secured with: Tape Dental Injury: Teeth and Oropharynx as per pre-operative assessment     Performed by: Verlan Friends Secured at: 20 cm

## 2012-07-16 NOTE — Anesthesia Postprocedure Evaluation (Signed)
  Anesthesia Post-op Note  Patient: Stephanie Wall  Procedure(s) Performed: Procedure(s): BILATERAL BREAST REDUCTION  (BREAST) (Bilateral)  Patient Location: PACU  Anesthesia Type:General  Level of Consciousness: awake, alert , oriented and patient cooperative  Airway and Oxygen Therapy: Patient Spontanous Breathing  Post-op Pain: none  Post-op Assessment: Post-op Vital signs reviewed, Patient's Cardiovascular Status Stable, Respiratory Function Stable, Patent Airway, No signs of Nausea or vomiting and Pain level controlled  Post-op Vital Signs: Reviewed and stable  Complications: No apparent anesthesia complications

## 2012-07-16 NOTE — Transfer of Care (Signed)
Immediate Anesthesia Transfer of Care Note  Patient: Stephanie Wall  Procedure(s) Performed: Procedure(s): BILATERAL BREAST REDUCTION  (BREAST) (Bilateral)  Patient Location: PACU  Anesthesia Type:General  Level of Consciousness: awake, sedated and patient cooperative  Airway & Oxygen Therapy: Patient Spontanous Breathing and Patient connected to face mask oxygen  Post-op Assessment: Report given to PACU RN and Post -op Vital signs reviewed and stable  Post vital signs: Reviewed and stable  Complications: No apparent anesthesia complications

## 2012-07-16 NOTE — Progress Notes (Signed)
Patient states itching is almost resolved.

## 2012-07-16 NOTE — Progress Notes (Signed)
Patient c/o itching on arms and legs.  Dr. Jean Rosenthal notified and orders received.

## 2012-07-16 NOTE — Anesthesia Preprocedure Evaluation (Signed)
Anesthesia Evaluation  Patient identified by MRN, date of birth, ID band Patient awake    Reviewed: Allergy & Precautions, H&P , NPO status , Patient's Chart, lab work & pertinent test results  Airway Mallampati: I TM Distance: >3 FB Neck ROM: Full    Dental  (+) Teeth Intact, Missing and Dental Advisory Given   Pulmonary asthma ,  breath sounds clear to auscultation        Cardiovascular Rhythm:Regular Rate:Normal     Neuro/Psych    GI/Hepatic   Endo/Other    Renal/GU      Musculoskeletal   Abdominal   Peds  Hematology   Anesthesia Other Findings   Reproductive/Obstetrics                           Anesthesia Physical Anesthesia Plan  ASA: II  Anesthesia Plan: General   Post-op Pain Management:    Induction: Intravenous  Airway Management Planned: Oral ETT  Additional Equipment:   Intra-op Plan:   Post-operative Plan: Extubation in OR  Informed Consent: I have reviewed the patients History and Physical, chart, labs and discussed the procedure including the risks, benefits and alternatives for the proposed anesthesia with the patient or authorized representative who has indicated his/her understanding and acceptance.   Dental advisory given  Plan Discussed with: CRNA, Anesthesiologist and Surgeon  Anesthesia Plan Comments:         Anesthesia Quick Evaluation

## 2012-07-16 NOTE — Progress Notes (Signed)
Patient had 2inch size bilateral drainage over nipple area on support bra.  Dr. Jamie Kato notifed and instructed patients husband to reinforce as needed.

## 2012-07-16 NOTE — Brief Op Note (Signed)
07/16/2012  2:02 PM  PATIENT:  Stephanie Wall  57 y.o. female  PRE-OPERATIVE DIAGNOSIS:  MACROMASTIA BILATERAL   POST-OPERATIVE DIAGNOSIS:  bilateral macromastia  PROCEDURE:  Procedure(s): BILATERAL BREAST REDUCTION  (BREAST) (Bilateral)  SURGEON:  Surgeon(s) and Role:    * Karie Fetch, MD - Primary  ASSISTANTS: Bonita Cox, CRNA  ANESTHESIA:   general  EBL:  Total I/O In: 2000 [I.V.:2000] Out: 300 [Urine:300]   DRAINS: (35F) Jackson-Pratt drain(s) with closed bulb suction in the Bilateral Breasts   LOCAL MEDICATIONS USED:  MARCAINE     SPECIMEN:  Source of Specimen:  Bilateral Breasts  DISPOSITION OF SPECIMEN:  PATHOLOGY  COUNTS:  YES  DICTATION: .Other Dictation: Dictation Number [id number]  PLAN OF CARE: Discharge to home after PACU  PATIENT DISPOSITION:  PACU - hemodynamically stable.   Delay start of Pharmacological VTE agent (>24hrs) due to surgical blood loss or risk of bleeding: not applicable

## 2012-07-16 NOTE — H&P (Signed)
  H&P faxed to surgical center.  -History and Physical Reviewed  -Patient has been re-examined  -No change in the plan of care  Stephanie Wall    

## 2012-07-16 NOTE — Progress Notes (Signed)
Patient's husband given Al Pimple instructions.  He states proficiency as he said he cared for his mothers drains several months ago.  Al Pimple instruction sheet given for home recording.

## 2012-07-17 ENCOUNTER — Encounter (HOSPITAL_BASED_OUTPATIENT_CLINIC_OR_DEPARTMENT_OTHER): Payer: Self-pay | Admitting: Plastic Surgery

## 2012-09-05 NOTE — Op Note (Signed)
NAMESHELLIE, ROGOFF NO.:  [id number]  MEDICAL RECORD NO.:  [id number]  LOCATION:                                 FACILITY:  PHYSICIAN:  Brantley Persons, M.D.DATE OF BIRTH:  [birth year]  DATE OF PROCEDURE:  07/16/2012 DATE OF DISCHARGE:                              OPERATIVE REPORT   PREOPERATIVE DIAGNOSIS:  Bilateral macromastia.  POSTOPERATIVE DIAGNOSIS:  Bilateral macromastia.  PROCEDURE:  Bilateral reduction mammoplasty.  ATTENDING SURGEON:  Brantley Persons, M.D.  ANESTHESIA:  General.  ANESTHESIOLOGIST:  Germaine Pomfret, M.D.  COMPLICATIONS:  None.  INDICATIONS FOR THE PROCEDURE:  The patient is a 57 year old female, who has bilateral macromastia that is clinically symptomatic.  She presents to undergo bilateral reduction mammoplasties.  PROCEDURE IN DETAIL:  The patient was marked in the preop holding area in the pattern of Wise for the future bilateral reduction mammoplasty. She was then taken to the OR, placed on the table in supine position. After adequate general anesthesia was obtained, the patient's chest was prepped with Techni-Care and draped in sterile fashion.  The bases of the breasts had been injected with 1% lidocaine plain.  After adequate hemostasis and anesthesia taken effect, the procedure was begun.  Both of the breast reductions were performed in the following similar manner.  The nipple-areolar complex was marked the 45-mm nipple marker. The skin was then incised and deepithelialized around the nipple-areolar complex down to the inframammary crease in inferior pedicle pattern. Next, the medial, superior, and lateral skin flaps were elevated down to the chest wall.  Excess fat and glandular tissue were removed from the inferior pedicle.  The nipple-areolar complex was examined and found to be pink and viable.  The wound was irrigated with saline irrigation. Meticulous hemostasis was obtained with the Bovie  electrocautery. Inferior pedicle was then centralized using 3-0 Prolene suture.  A #10 JP flat fully fluted drain was placed into the wound.  The skin flaps were brought together at the inverted T junction with a 2-0 Prolene suture.  The incisions were then stapled for temporary closure.  The breasts were compared and found to have good shape and symmetry.  The incisions were then closed from the medial aspect of the JP drain to the medial aspect of the [facility] incision by 1st placing a few 3-0 Monocryl sutures in the dermal layer to tack it together and then both the dermal and cuticular layer were closed with a 2-0 Quill PDO barbed suture.  Lateral to the JP drain, incision was closed with 3-0 Monocryl in the dermal layer followed by 3-0 Monocryl running intracuticular stitch on skin.  The patient was placed in the upright position.  The future location of the nipple-areolar complex was marked on both breast mounds using 45-mm nipple marker.  She was then placed back in the recumbent position.  Both of the nipple-areolar complexes were brought out onto the breast mounds in the following similar manner.  The skin was incised as marked and removed full thickness into the subcutaneous tissues.  The nipple-areolar complex was examined, found to be pink and viable, then brought out through this aperture and sewn in place using a 4-0 Monocryl in  the dermal layer, followed by a 5-0 Monocryl running intracuticular stitch on skin.  The dermal layer of the vertical limb of the Wise pattern had been closed with 3-0 Monocryl suture.  At this point, the cuticular layer was closed in continuity with a 5-0 Monocryl from the nipple- areolar closure in an intracuticular stitch.  The JP drains were sewn in place using 3-0 nylon suture.  The bases of both breasts as well as the pectoral muscle had been infiltrated with 1.3% Exparel (total of 266 mg) to provide postoperative pain control.  The incisions were  dressed with benzoin and Steri-Strips, and the nipples additionally with bacitracin ointment and Adaptic. 4x4s were placed over the incisions and ABD pads in the axillary areas.  The patient was then placed into a light postoperative support bra.  There no complications.  The patient tolerated the procedure well.  The final needle and sponge counts were reportedly correct at the end the case.  The patient was then awakened from the general anesthesia and taken to recovery room in stable condition.  She was also recovered without complications.  Both the patient and her family were given proper postoperative wound care instructions including care of the JP drains. She was then discharged in the care of her family in stable condition. Followup appointment will be in the office in few days.          ______________________________ Brantley Persons, M.D.     MC/MEDQ  D:  09/04/2012  T:  09/05/2012  Job:  [id number]

## 2013-05-29 ENCOUNTER — Other Ambulatory Visit: Payer: Self-pay

## 2013-05-29 DIAGNOSIS — Z1231 Encounter for screening mammogram for malignant neoplasm of breast: Secondary | ICD-10-CM

## 2013-05-29 DIAGNOSIS — Z9889 Other specified postprocedural states: Secondary | ICD-10-CM

## 2013-07-01 ENCOUNTER — Other Ambulatory Visit (HOSPITAL_COMMUNITY)
Admission: RE | Admit: 2013-07-01 | Discharge: 2013-07-01 | Disposition: A | Discharge status: Home / Self Care | Payer: BC Managed Care – PPO | Source: Ambulatory Visit | Attending: Obstetrics and Gynecology | Admitting: Obstetrics and Gynecology

## 2013-07-01 ENCOUNTER — Ambulatory Visit (INDEPENDENT_AMBULATORY_CARE_PROVIDER_SITE_OTHER): Payer: BC Managed Care – PPO | Admitting: Obstetrics and Gynecology

## 2013-07-01 ENCOUNTER — Encounter (INDEPENDENT_AMBULATORY_CARE_PROVIDER_SITE_OTHER): Payer: Self-pay

## 2013-07-01 ENCOUNTER — Encounter: Payer: Self-pay | Admitting: Obstetrics and Gynecology

## 2013-07-01 VITALS — BP 130/70 | Ht 67.5 in | Wt 213.4 lb

## 2013-07-01 DIAGNOSIS — Z0000 Encounter for general adult medical examination without abnormal findings: Secondary | ICD-10-CM | POA: Insufficient documentation

## 2013-07-01 DIAGNOSIS — N898 Other specified noninflammatory disorders of vagina: Secondary | ICD-10-CM

## 2013-07-01 DIAGNOSIS — Z1151 Encounter for screening for human papillomavirus (HPV): Secondary | ICD-10-CM | POA: Insufficient documentation

## 2013-07-01 DIAGNOSIS — E669 Obesity, unspecified: Secondary | ICD-10-CM | POA: Insufficient documentation

## 2013-07-01 DIAGNOSIS — Z1212 Encounter for screening for malignant neoplasm of rectum: Secondary | ICD-10-CM

## 2013-07-01 DIAGNOSIS — Z01419 Encounter for gynecological examination (general) (routine) without abnormal findings: Secondary | ICD-10-CM | POA: Insufficient documentation

## 2013-07-01 LAB — POCT WET PREP (WET MOUNT)

## 2013-07-01 LAB — POC HEMOCCULT BLD/STL (OFFICE/1-CARD/DIAGNOSTIC): Fecal Occult Blood, POC: NEGATIVE

## 2013-07-01 MED ORDER — ESTROGENS, CONJUGATED 0.625 MG/GM VA CREA: 1.0000 | TOPICAL_CREAM | VAGINAL | Status: DC

## 2013-07-01 NOTE — Patient Instructions (Signed)
dexa scan to be scheduled in office at [place]

## 2013-07-01 NOTE — Progress Notes (Signed)
This chart was scribed by Ludger Nutting, Medical Scribe, for Dr. Mallory Shirk on 07/01/13 at 9:15 AM. This chart was reviewed by Dr. Mallory Shirk and is accurate.   Assessment:  Annual Gyn Exam   Plan:  1. pap smear done, no more needed 2. return annually or prn 3    Annual mammogram advised, scheduled for 07/04/13 4.   Take stool softener to prevent constipation  5.   Dexa scan   Subjective:  Stephanie Wall is a 58 y.o. female No obstetric history on file. who presents for annual exam. No LMP recorded. Patient is postmenopausal. The patient has no complaints today but requests vaginal cream.   The following portions of the patient's history were reviewed and updated as appropriate: allergies, current medications, past family history, past medical history, past social history, past surgical history and problem list.  Review of Systems Constitutional: negative Gastrointestinal: negative Genitourinary: vaginal dryness  Objective:  BP 130/70  Ht 5' 7.5" (1.715 m)  Wt 213 lb 6.4 oz (96.798 kg)  BMI 32.91 kg/m2   BMI: Body mass index is 32.91 kg/(m^2).  General Appearance: Alert, appropriate appearance for age. No acute distress HEENT: Grossly normal Neck / Thyroid:  Cardiovascular: RRR; normal S1, S2, no murmur Lungs: CTA bilaterally Back: No CVAT Breast Exam: No dimpling, nipple retraction or discharge. No masses or nodes., Normal to inspection, Normal breast tissue bilaterally and No masses or nodes. Well healed surgical scar bilaterally status post breast reduction Gastrointestinal: Soft, non-tender, no masses or organomegaly Pelvic Exam: VULVA: normal appearing vulva with no masses, tenderness or lesions,  VAGINA: normal appearing vagina with normal color and discharge, no lesions, vaginal tissues are atrophic,  CERVIX: surgically absent,  UTERUS: surgically absent, vaginal cuff well healed and well supported.  ADNEXA: normal adnexa in size, nontender and no  masses Rectovaginal: normal rectal, no masses and guaiac negative stool obtained Lymphatic Exam: Non-palpable nodes in neck, clavicular, axillary, or inguinal regions  Skin: no rash or abnormalities Neurologic: Normal gait and speech, no tremor  Psychiatric: Alert and oriented, appropriate affect.  Urinalysis:Not done  Mallory Shirk. MD Pgr [phone] 9:15 AM

## 2013-07-02 ENCOUNTER — Telehealth: Payer: Self-pay | Admitting: Obstetrics and Gynecology

## 2013-07-03 NOTE — Telephone Encounter (Signed)
Pt states [facility] did not have RX for premarin. Called [facility] to [place], [place] per order in [place] for 07/01/2013. Pt aware.

## 2013-07-04 ENCOUNTER — Ambulatory Visit
Admission: RE | Admit: 2013-07-04 | Discharge: 2013-07-04 | Disposition: A | Discharge status: Home / Self Care | Payer: BC Managed Care – PPO | Source: Ambulatory Visit

## 2013-07-04 DIAGNOSIS — Z9889 Other specified postprocedural states: Secondary | ICD-10-CM

## 2013-07-04 DIAGNOSIS — Z1231 Encounter for screening mammogram for malignant neoplasm of breast: Secondary | ICD-10-CM

## 2013-07-15 ENCOUNTER — Ambulatory Visit
Admission: RE | Admit: 2013-07-15 | Discharge: 2013-07-15 | Disposition: A | Discharge status: Home / Self Care | Payer: BC Managed Care – PPO | Source: Ambulatory Visit | Attending: Obstetrics and Gynecology | Admitting: Obstetrics and Gynecology

## 2013-07-15 DIAGNOSIS — Z0000 Encounter for general adult medical examination without abnormal findings: Secondary | ICD-10-CM

## 2013-07-19 ENCOUNTER — Telehealth: Payer: Self-pay | Admitting: Adult Health

## 2013-07-19 NOTE — Telephone Encounter (Signed)
Pt aware of dexa scan

## 2013-08-04 ENCOUNTER — Emergency Department (HOSPITAL_COMMUNITY)
Admission: EM | Admit: 2013-08-04 | Discharge: 2013-08-04 | Disposition: A | Discharge status: Home / Self Care | Payer: No Typology Code available for payment source | Attending: Emergency Medicine | Admitting: Emergency Medicine

## 2013-08-04 ENCOUNTER — Emergency Department (HOSPITAL_COMMUNITY): Payer: No Typology Code available for payment source

## 2013-08-04 ENCOUNTER — Other Ambulatory Visit: Payer: Self-pay

## 2013-08-04 ENCOUNTER — Encounter (HOSPITAL_COMMUNITY): Payer: Self-pay | Admitting: Emergency Medicine

## 2013-08-04 DIAGNOSIS — J9801 Acute bronchospasm: Secondary | ICD-10-CM

## 2013-08-04 DIAGNOSIS — J45909 Unspecified asthma, uncomplicated: Secondary | ICD-10-CM | POA: Insufficient documentation

## 2013-08-04 DIAGNOSIS — Z792 Long term (current) use of antibiotics: Secondary | ICD-10-CM | POA: Insufficient documentation

## 2013-08-04 DIAGNOSIS — R0789 Other chest pain: Secondary | ICD-10-CM | POA: Insufficient documentation

## 2013-08-04 DIAGNOSIS — R197 Diarrhea, unspecified: Secondary | ICD-10-CM | POA: Insufficient documentation

## 2013-08-04 DIAGNOSIS — J329 Chronic sinusitis, unspecified: Secondary | ICD-10-CM | POA: Insufficient documentation

## 2013-08-04 DIAGNOSIS — Z79899 Other long term (current) drug therapy: Secondary | ICD-10-CM | POA: Insufficient documentation

## 2013-08-04 DIAGNOSIS — Z8742 Personal history of other diseases of the female genital tract: Secondary | ICD-10-CM | POA: Insufficient documentation

## 2013-08-04 DIAGNOSIS — IMO0002 Reserved for concepts with insufficient information to code with codable children: Secondary | ICD-10-CM | POA: Insufficient documentation

## 2013-08-04 DIAGNOSIS — E669 Obesity, unspecified: Secondary | ICD-10-CM | POA: Insufficient documentation

## 2013-08-04 LAB — CBC
HCT: 40.9 % (ref 36.0–46.0)
Hemoglobin: 13.3 g/dL (ref 12.0–15.0)
MCH: 30.6 pg (ref 26.0–34.0)
MCHC: 32.5 g/dL (ref 30.0–36.0)
MCV: 94 fL (ref 78.0–100.0)
Platelets: 204 10*3/uL (ref 150–400)
RBC: 4.35 MIL/uL (ref 3.87–5.11)
RDW: 14.4 % (ref 11.5–15.5)
WBC: 3.5 10*3/uL — ABNORMAL LOW (ref 4.0–10.5)

## 2013-08-04 LAB — BASIC METABOLIC PANEL
BUN: 9 mg/dL (ref 6–23)
CO2: 27 mEq/L (ref 19–32)
Calcium: 9.6 mg/dL (ref 8.4–10.5)
Chloride: 98 mEq/L (ref 96–112)
Creatinine, Ser: 0.86 mg/dL (ref 0.50–1.10)
GFR calc Af Amer: 84 mL/min — ABNORMAL LOW (ref >90)
GFR calc non Af Amer: 73 mL/min — ABNORMAL LOW (ref >90)
Glucose, Bld: 94 mg/dL (ref 70–99)
Potassium: 4.2 mEq/L (ref 3.7–5.3)
Sodium: 139 mEq/L (ref 137–147)

## 2013-08-04 LAB — I-STAT TROPONIN, ED: Troponin i, poc: 0 ng/mL (ref 0.00–0.08)

## 2013-08-04 MED ORDER — METHOCARBAMOL 500 MG PO TABS: ORAL_TABLET | ORAL | Status: DC

## 2013-08-04 MED ORDER — PREDNISONE 20 MG PO TABS
60.0000 mg | ORAL_TABLET | Freq: Once | ORAL | Status: AC
  Administered 2013-08-04: 60 mg via ORAL
  Filled 2013-08-04: qty 3

## 2013-08-04 MED ORDER — PREDNISONE 20 MG PO TABS: ORAL_TABLET | ORAL | Status: DC

## 2013-08-04 MED ORDER — ALBUTEROL SULFATE (2.5 MG/3ML) 0.083% IN NEBU: INHALATION_SOLUTION | RESPIRATORY_TRACT | Status: DC

## 2013-08-04 MED ORDER — LORATADINE 10 MG PO TABS: 10.0000 mg | ORAL_TABLET | Freq: Every day | ORAL | Status: DC

## 2013-08-04 MED ORDER — AMOXICILLIN-POT CLAVULANATE 875-125 MG PO TABS: 1.0000 | ORAL_TABLET | Freq: Two times a day (BID) | ORAL | Status: DC

## 2013-08-04 MED ORDER — ALBUTEROL SULFATE (2.5 MG/3ML) 0.083% IN NEBU
5.0000 mg | INHALATION_SOLUTION | Freq: Once | RESPIRATORY_TRACT | Status: AC
  Administered 2013-08-04: 5 mg via RESPIRATORY_TRACT
  Filled 2013-08-04: qty 6

## 2013-08-04 MED ORDER — IPRATROPIUM BROMIDE 0.02 % IN SOLN
0.5000 mg | Freq: Once | RESPIRATORY_TRACT | Status: AC
  Administered 2013-08-04: 0.5 mg via RESPIRATORY_TRACT
  Filled 2013-08-04: qty 2.5

## 2013-08-04 MED ORDER — CYCLOBENZAPRINE HCL 10 MG PO TABS
5.0000 mg | ORAL_TABLET | Freq: Once | ORAL | Status: AC
  Administered 2013-08-04: 5 mg via ORAL
  Filled 2013-08-04: qty 1

## 2013-08-04 MED ORDER — TRIAMCINOLONE ACETONIDE 55 MCG/ACT NA AERO: 2.0000 | INHALATION_SPRAY | Freq: Every day | NASAL | Status: DC

## 2013-08-04 NOTE — Discharge Instructions (Signed)
Try heat over your face, this will help with your sinus pain and help your sinuses drain. Use the nasal spray to help your sinuses drain. Take the prednisone until gone. Use your nebulizer for wheezing or shortness of breath. Stop the zpak and start taking the augmentin until gone. Avoid milk products until the diarrhea is gone, DO eat yogurt to replace the "good" bacteria in your GI tract. Take the muscle relaxer for your chest wall pain. You can take ibuprofen 600 mg and/or acetaminophen 100 mg 4 times a day for pain.  Return to the ED if you get chest pain that is constant, you are struggling to breathe or you feel worse.

## 2013-08-04 NOTE — ED Notes (Signed)
Pt reports shes had "rattling in my chest" this week along with chest head and back pain. She went to PCP and "they gave me a zpak, i think maybe for pneumonia, i have been taking it but i still feel bad."

## 2013-08-04 NOTE — ED Provider Notes (Signed)
CSN: [id number]     Arrival date & time 08/04/13  3329 History   First MD Initiated Contact with Patient 08/04/13 [phone]     Chief Complaint  Patient presents with  . Chest Pain     (Consider location/radiation/quality/duration/timing/severity/associated sxs/prior Treatment) HPI Patient reports 5 days ago she woke up with a frontal headache. She states the light bothered her eyes. She felt tired and achy all over. She states she had some sneezing. She also had a mild cough but denies rhinorrhea. She states her nose was stuffy. She had soreness in her neck and her shoulders. She reports fever up to 101.6 for 2 days that stopped yesterday. She did not have chills or shaking. She also describes some central chest pain that radiates across her chest that comes and goes and lasts a few seconds. She states it hurts more when she moves her arms. She describes the pain as a pressure. She states she feels short of breath when she is walking. She denies vomiting but has had nausea. She states she had 3 episodes of diarrhea today without blood. She also states she's been wheezing off and on. She denies any pain or swelling of her legs. She denies any recent travel. She denies being bed bound. She was seen by her PCP 2 days ago when she was feeling lightheaded. She was diagnosed with an infection and started on a Z-Pak. She has taken her medications daily, today is the third dose. She denies feeling any better.  Family history her maternal grandmother is alive at age 26 and has congestive heart failure, there is no family history of coronary artery disease, DVT, or PE  PCP Dr Clayburn Pert  Past Medical History  Diagnosis Date  . Abnormal glandular Papanicolaou smear of cervix   . Cervicalgia   . Obesity, unspecified   . Fibroids     uterus  . Asthma   . ALLERGIC RHINITIS   . Back pain   . Wears glasses   . Snores    Past Surgical History  Procedure Laterality Date  . Cholecystectomy  1976  . Breast  cyst excision  08/09/1982 and 1997  . Colonoscopy    . Breast reduction surgery Bilateral 07/16/2012    Procedure: BILATERAL BREAST REDUCTION  (BREAST);  Surgeon: Charlene Brooke, MD;  Location: [facility];  Service: Plastics;  Laterality: Bilateral;   Family History  Problem Relation Age of Onset  . Hypertension Mother   . Hypertension Father   . HIV      AIDS deceased age 79 in Aug 09, 2007.   History  Substance Use Topics  . Smoking status: Never Smoker   . Smokeless tobacco: Not on file  . 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     Allergies  Demerol and Dilaudid  Home Medications   Current Outpatient Rx  Name  Route  Sig  Dispense  Refill  . acetaminophen (TYLENOL) 500 MG tablet   Oral   Take 1,000 mg by mouth every 6 (six) hours as needed. For pain         . albuterol (VENTOLIN HFA) 108 (90 BASE) MCG/ACT inhaler   Inhalation   Inhale 2  puffs into the lungs every 6 (six) hours as needed for wheezing or shortness of breath (Rescue inhaler- if needed).   1 Inhaler   prn   . azithromycin (ZITHROMAX) 250 MG tablet   Oral   Take 250 mg by mouth daily.         . beclomethasone (QVAR) 80 MCG/ACT inhaler   Inhalation   Inhale 2 puffs into the lungs 2 (two) times daily. Rinse mouth   1 Inhaler   prn   . fexofenadine-pseudoephedrine (ALLEGRA-D) 60-120 MG per tablet   Oral   Take 1 tablet by mouth 2 (two) times daily.         . fish oil-omega-3 fatty acids 1000 MG capsule   Oral   Take 2 g by mouth daily.         . fluticasone (FLONASE) 50 MCG/ACT nasal spray   Nasal   2 sprays by Nasal route daily. 2 sprays each nostril once daily   16 g   prn   . montelukast (SINGULAIR) 10 MG tablet   Oral   Take 10 mg by mouth at bedtime.         . niacin  500 MG tablet   Oral   Take 500 mg by mouth daily.         . vitamin C (ASCORBIC ACID) 500 MG tablet   Oral   Take 500 mg by mouth daily.           BP 135/79  Pulse 87  Temp(Src) 98.9 F (37.2 C) (Oral)  Resp 16  Wt 210 lb (95.255 kg)  SpO2 100%  Vital signs normal   Physical Exam  Nursing note and vitals reviewed. Constitutional: She is oriented to person, place, and time. She appears well-developed and well-nourished.  Non-toxic appearance. She does not appear ill. No distress.  HENT:  Head: Normocephalic and atraumatic.  Right Ear: External ear normal.  Left Ear: External ear normal.  Nose: Nose normal. No mucosal edema or rhinorrhea.  Mouth/Throat: Oropharynx is clear and moist and mucous membranes are normal. No dental abscesses or uvula swelling.  Eyes: Conjunctivae and EOM are normal. Pupils are equal, round, and reactive to light.  Neck: Normal range of motion and full passive range of motion without pain. Neck supple.  Cardiovascular: Normal rate, regular rhythm and normal heart sounds.  Exam reveals no gallop and no friction rub.   No murmur heard. Pulmonary/Chest: Effort normal and breath sounds normal. No respiratory distress. She has no wheezes. She has no rhonchi. She has no rales. She exhibits tenderness. She exhibits no crepitus.    Abdominal: Soft. Normal appearance and bowel sounds are normal. She exhibits no distension. There is no tenderness. There is no rebound and no guarding.  Musculoskeletal: Normal range of motion. She exhibits no edema and no tenderness.  Moves all extremities well.   Neurological: She is alert and oriented to person, place, and time. She has normal strength. No cranial nerve deficit.  Skin: Skin is warm, dry and intact. No rash noted. No erythema. No pallor.  Psychiatric: She has a normal mood and affect. Her speech is normal and behavior is normal. Her mood appears not anxious.    ED Course  Procedures (including critical  care time)  Medications  albuterol (PROVENTIL) (2.5 MG/3ML) 0.083% nebulizer solution 5 mg (5 mg Nebulization Given 08/04/13 1038)  ipratropium (ATROVENT) nebulizer solution 0.5 mg (0.5 mg Nebulization Given 08/04/13 1038)  predniSONE (DELTASONE) tablet 60 mg (60  mg Oral Given 08/04/13 1025)  cyclobenzaprine (FLEXERIL) tablet 5 mg (5 mg Oral Given 08/04/13 1024)    Recheck at discharge, pt has much improved air movement. She is still c/o a fullness or pressure in her face. We discussed her discharge instructions.   Labs Review Results for orders placed during the hospital encounter of 08/04/13  CBC      Result Value Ref Range   WBC 3.5 (*) 4.0 - 10.5 K/uL   RBC 4.35  3.87 - 5.11 MIL/uL   Hemoglobin 13.3  12.0 - 15.0 g/dL   HCT 40.9  36.0 - 46.0 %   MCV 94.0  78.0 - 100.0 fL   MCH 30.6  26.0 - 34.0 pg   MCHC 32.5  30.0 - 36.0 g/dL   RDW 14.4  11.5 - 15.5 %   Platelets 204  150 - 400 K/uL  BASIC METABOLIC PANEL      Result Value Ref Range   Sodium 139  137 - 147 mEq/L   Potassium 4.2  3.7 - 5.3 mEq/L   Chloride 98  96 - 112 mEq/L   CO2 27  19 - 32 mEq/L   Glucose, Bld 94  70 - 99 mg/dL   BUN 9  6 - 23 mg/dL   Creatinine, Ser 0.86  0.50 - 1.10 mg/dL   Calcium 9.6  8.4 - 10.5 mg/dL   GFR calc non Af Amer 73 (*) >90 mL/min   GFR calc Af Amer 84 (*) >90 mL/min  I-STAT TROPOININ, ED      Result Value Ref Range   Troponin i, poc 0.00  0.00 - 0.08 ng/mL   Comment 3            Laboratory interpretation all normal except low total WBC count consistent with viral illness    Imaging Review Dg Chest 2 View  08/04/2013   CLINICAL DATA:  Chest pain and pressure for several days  EXAM: CHEST  2 VIEW  COMPARISON:  DG CHEST 2 VIEW dated 02/27/2009.  FINDINGS: The heart size and mediastinal contours are within normal limits. Both lungs are clear. The visualized skeletal structures are unremarkable. S shaped thoracolumbar curvature reidentified. Cholecystectomy clips are noted.  IMPRESSION: No  active cardiopulmonary disease.   Electronically Signed   By: Conchita Paris M.D.   On: 08/04/2013 11:12     EKG Interpretation None        Date: 08/04/2013  Rate: 87  Rhythm: normal sinus rhythm  QRS Axis: normal  Intervals: normal  ST/T Wave abnormalities: normal  Conduction Disutrbances:none  Narrative Interpretation: Q wave V1, low voltage  Old EKG Reviewed: none available    MDM  patient presents with sinus symptoms for the past week with fever and facial pressure. She has a stuffy nose and atypical chest pain only lasted a few seconds and is made worse by moving her arms. Her tests are normal for alarming conditions such as pneumonia or MI. Patient is felt ready to be discharged.    Final diagnoses:  Sinusitis  Bronchospasm  Atypical chest pain  Diarrhea     New Prescriptions   ALBUTEROL (PROVENTIL) (2.5 MG/3ML) 0.083% NEBULIZER SOLUTION    Use 1 or 2 every 4 hrs prn wheezing or shortness of breath   AMOXICILLIN-CLAVULANATE (AUGMENTIN) 875-125 MG PER TABLET    Take 1 tablet by mouth 2 (two) times daily.   LORATADINE (CLARITIN) 10 MG TABLET    Take 1 tablet (10 mg total) by mouth  daily.   METHOCARBAMOL (ROBAXIN) 500 MG TABLET    Take 1 or 2 po Q 6hrs for muscle soreness   PREDNISONE (DELTASONE) 20 MG TABLET    Take 3 po QD x 2d starting tomorrow, then 2 po QD x 3d then 1 po QD x 3d   TRIAMCINOLONE (NASACORT ALLERGY 24HR) 55 MCG/ACT AERO NASAL INHALER    Place 2 sprays into the nose daily.    Plan discharge  Rolland Porter, MD, Alanson Aly, MD 08/04/13 [phone]

## 2014-05-02 ENCOUNTER — Encounter: Payer: Self-pay | Admitting: *Deleted

## 2014-05-30 ENCOUNTER — Ambulatory Visit (INDEPENDENT_AMBULATORY_CARE_PROVIDER_SITE_OTHER): Payer: No Typology Code available for payment source | Admitting: Family Medicine

## 2014-05-30 ENCOUNTER — Encounter: Payer: Self-pay | Admitting: Family Medicine

## 2014-05-30 VITALS — BP 106/64 | HR 78 | Temp 98.1°F | Resp 14 | Ht 68.0 in | Wt 218.0 lb

## 2014-05-30 DIAGNOSIS — Z23 Encounter for immunization: Secondary | ICD-10-CM

## 2014-05-30 DIAGNOSIS — Z0000 Encounter for general adult medical examination without abnormal findings: Secondary | ICD-10-CM | POA: Diagnosis not present

## 2014-05-30 DIAGNOSIS — J4520 Mild intermittent asthma, uncomplicated: Secondary | ICD-10-CM | POA: Diagnosis not present

## 2014-05-30 DIAGNOSIS — Z1321 Encounter for screening for nutritional disorder: Secondary | ICD-10-CM | POA: Diagnosis not present

## 2014-05-30 DIAGNOSIS — E669 Obesity, unspecified: Secondary | ICD-10-CM

## 2014-05-30 DIAGNOSIS — J45909 Unspecified asthma, uncomplicated: Secondary | ICD-10-CM | POA: Insufficient documentation

## 2014-05-30 LAB — LIPID PANEL
Cholesterol: 182 mg/dL (ref 0–200)
HDL: 71 mg/dL (ref >39)
LDL Cholesterol: 97 mg/dL (ref 0–99)
Total CHOL/HDL Ratio: 2.6 Ratio
Triglycerides: 68 mg/dL (ref <150)
VLDL: 14 mg/dL (ref 0–40)

## 2014-05-30 LAB — CBC WITH DIFFERENTIAL/PLATELET
Basophils Absolute: 0 10*3/uL (ref 0.0–0.1)
Basophils Relative: 0 % (ref 0–1)
Eosinophils Absolute: 0.1 10*3/uL (ref 0.0–0.7)
Eosinophils Relative: 2 % (ref 0–5)
HCT: 40.2 % (ref 36.0–46.0)
Hemoglobin: 13.4 g/dL (ref 12.0–15.0)
Lymphocytes Relative: 40 % (ref 12–46)
Lymphs Abs: 2 10*3/uL (ref 0.7–4.0)
MCH: 30.1 pg (ref 26.0–34.0)
MCHC: 33.3 g/dL (ref 30.0–36.0)
MCV: 90.3 fL (ref 78.0–100.0)
MPV: 10.1 fL (ref 8.6–12.4)
Monocytes Absolute: 0.4 10*3/uL (ref 0.1–1.0)
Monocytes Relative: 7 % (ref 3–12)
Neutro Abs: 2.6 10*3/uL (ref 1.7–7.7)
Neutrophils Relative %: 51 % (ref 43–77)
Platelets: 232 10*3/uL (ref 150–400)
RBC: 4.45 MIL/uL (ref 3.87–5.11)
RDW: 14.4 % (ref 11.5–15.5)
WBC: 5.1 10*3/uL (ref 4.0–10.5)

## 2014-05-30 LAB — COMPREHENSIVE METABOLIC PANEL
ALT: 20 U/L (ref 0–35)
AST: 19 U/L (ref 0–37)
Albumin: 4.3 g/dL (ref 3.5–5.2)
Alkaline Phosphatase: 76 U/L (ref 39–117)
BUN: 13 mg/dL (ref 6–23)
CO2: 23 mEq/L (ref 19–32)
Calcium: 9.6 mg/dL (ref 8.4–10.5)
Chloride: 105 mEq/L (ref 96–112)
Creat: 0.89 mg/dL (ref 0.50–1.10)
Glucose, Bld: 81 mg/dL (ref 70–99)
Potassium: 4.3 mEq/L (ref 3.5–5.3)
Sodium: 142 mEq/L (ref 135–145)
Total Bilirubin: 0.5 mg/dL (ref 0.2–1.2)
Total Protein: 7.2 g/dL (ref 6.0–8.3)

## 2014-05-30 MED ORDER — FEXOFENADINE-PSEUDOEPHED ER 60-120 MG PO TB12: 1.0000 | ORAL_TABLET | Freq: Two times a day (BID) | ORAL | Status: DC

## 2014-05-30 MED ORDER — BECLOMETHASONE DIPROPIONATE 80 MCG/ACT IN AERS: 2.0000 | INHALATION_SPRAY | Freq: Two times a day (BID) | RESPIRATORY_TRACT | Status: DC

## 2014-05-30 MED ORDER — ALBUTEROL SULFATE HFA 108 (90 BASE) MCG/ACT IN AERS
2.0000 | INHALATION_SPRAY | RESPIRATORY_TRACT | Status: DC | PRN
Start: 2014-05-30 — End: 2014-06-03

## 2014-05-30 NOTE — Assessment & Plan Note (Signed)
Discussed dietary changes, restart walking program

## 2014-05-30 NOTE — Assessment & Plan Note (Addendum)
She typically will need her inhaled steroids during the spring and fall. I did give her a sample of Pulmicort in case she is unable to afford her Qvar and she will be pain for this out of pocket. The Pulmicort was 90 g 2 puffs twice a day

## 2014-05-30 NOTE — Progress Notes (Signed)
Patient ID: Stephanie Wall, female   DOB: 02-26-1956, 59 y.o.   MRN: [id number]   Subjective:    Patient ID: Stephanie Wall, female    DOB: February 02, 1956, 59 y.o.   MRN: [id number]  Patient presents for New Patient CPE  patient here to establish care. Her previous primary care provider was Dr. Criss Rosales but she has not seen her in about 2 years. She is followed by Dr. Glo Herring for her GYN. She was seen by Dr. Collene Mares in the past for her colonoscopy.` Her medications and history were reviewed. She is history of lumpectomies from both breast pathology was benign. Her mammogram is not due until March of this year. Her colonoscopy and Pap smear are up-to-date.  She is history of allergy-induced asthma she does use Qvar only during the months when she needs it as well as her rescue inhaler albuterol. Unfortunately she has a very high deductible lata pad a pocket for her medications for the next few months.  She is due for fasting labs today.  She is history of osteoarthritis of the hips she does not take any medication for this on a regular basis. Review Of Systems:  GEN- denies fatigue, fever, weight loss,weakness, recent illness HEENT- denies eye drainage, change in vision, nasal discharge, CVS- denies chest pain, palpitations RESP- denies SOB, cough, wheeze ABD- denies N/V, change in stools, abd pain GU- denies dysuria, hematuria, dribbling, incontinence MSK- denies joint pain, muscle aches, injury Neuro- denies headache, dizziness, syncope, seizure activity       Objective:    BP 106/64 mmHg  Pulse 78  Temp(Src) 98.1 F (36.7 C) (Oral)  Resp 14  Ht 5\' 8"  (1.727 m)  Wt 218 lb (98.884 kg)  BMI 33.15 kg/m2 GEN- NAD, alert and oriented x3, obese HEENT- PERRL, EOMI, non injected sclera, pink conjunctiva, MMM, oropharynx clear Neck- Supple, no thyromegaly CVS- RRR, no murmur RESP-CTAB ABD-NABS,soft,NT,ND EXT- No edema Pulses- Radial, DP- 2+        Assessment & Plan:      Problem  List Items Addressed This Visit      Unprioritized   Routine general medical examination at a health care facility - Primary   Relevant Orders   CBC with Differential/Platelet   Comprehensive metabolic panel   Lipid panel   TSH   Tdap vaccine greater than or equal to 7yo IM (Completed)    Other Visit Diagnoses    Encounter for vitamin deficiency screening        Relevant Orders    Vitamin D, 25-hydroxy    Need for prophylactic vaccination with combined diphtheria-tetanus-pertussis (DTP) vaccine        Relevant Orders    Tdap vaccine greater than or equal to 7yo IM (Completed)       Note: This dictation was prepared with Dragon dictation along with smaller phrase technology. Any transcriptional errors that result from this process are unintentional.

## 2014-05-30 NOTE — Assessment & Plan Note (Signed)
No PAP needed, Mammogram in March, Bone Density UTD, Colonscopy report to be obtained, fasting labs, TDAP, given, Hold on Shingles, she wants to hold Pneumonia vaccine today as well

## 2014-05-30 NOTE — Patient Instructions (Signed)
Continue current medications REstart exercise program, low fat, low carb diet TDAP booster today  F/U as needed, or at least once a year

## 2014-05-31 LAB — TSH: TSH: 1.398 u[IU]/mL (ref 0.350–4.500)

## 2014-05-31 LAB — VITAMIN D 25 HYDROXY (VIT D DEFICIENCY, FRACTURES): Vit D, 25-Hydroxy: 21 ng/mL — ABNORMAL LOW (ref 30–100)

## 2014-06-03 ENCOUNTER — Other Ambulatory Visit: Payer: Self-pay

## 2014-06-03 ENCOUNTER — Other Ambulatory Visit: Payer: Self-pay | Admitting: *Deleted

## 2014-06-03 DIAGNOSIS — Z1231 Encounter for screening mammogram for malignant neoplasm of breast: Secondary | ICD-10-CM

## 2014-06-03 MED ORDER — BECLOMETHASONE DIPROPIONATE 80 MCG/ACT IN AERS: 2.0000 | INHALATION_SPRAY | Freq: Two times a day (BID) | RESPIRATORY_TRACT | Status: DC

## 2014-06-03 MED ORDER — FEXOFENADINE-PSEUDOEPHED ER 60-120 MG PO TB12: 1.0000 | ORAL_TABLET | Freq: Two times a day (BID) | ORAL | Status: DC

## 2014-06-03 MED ORDER — ALBUTEROL SULFATE HFA 108 (90 BASE) MCG/ACT IN AERS: 2.0000 | INHALATION_SPRAY | RESPIRATORY_TRACT | Status: DC | PRN

## 2014-06-03 MED ORDER — VITAMIN D (ERGOCALCIFEROL) 1.25 MG (50000 UNIT) PO CAPS: 50000.0000 [IU] | ORAL_CAPSULE | ORAL | Status: DC

## 2014-06-05 ENCOUNTER — Encounter: Payer: Self-pay | Admitting: Family Medicine

## 2014-07-08 ENCOUNTER — Ambulatory Visit
Admission: RE | Admit: 2014-07-08 | Discharge: 2014-07-08 | Disposition: A | Discharge status: Home / Self Care | Payer: No Typology Code available for payment source | Source: Ambulatory Visit

## 2014-07-08 DIAGNOSIS — Z1231 Encounter for screening mammogram for malignant neoplasm of breast: Secondary | ICD-10-CM

## 2014-07-19 IMAGING — MG MM DIGITAL SCREENING BILAT
8 series · 8 of 8 positions shown · non-contrast
Comparison: Previous exams.

CLINICAL DATA: Screening.

DIGITAL BILATERAL SCREENING MAMMOGRAM WITH CAD

[R CC (1 of 2)]
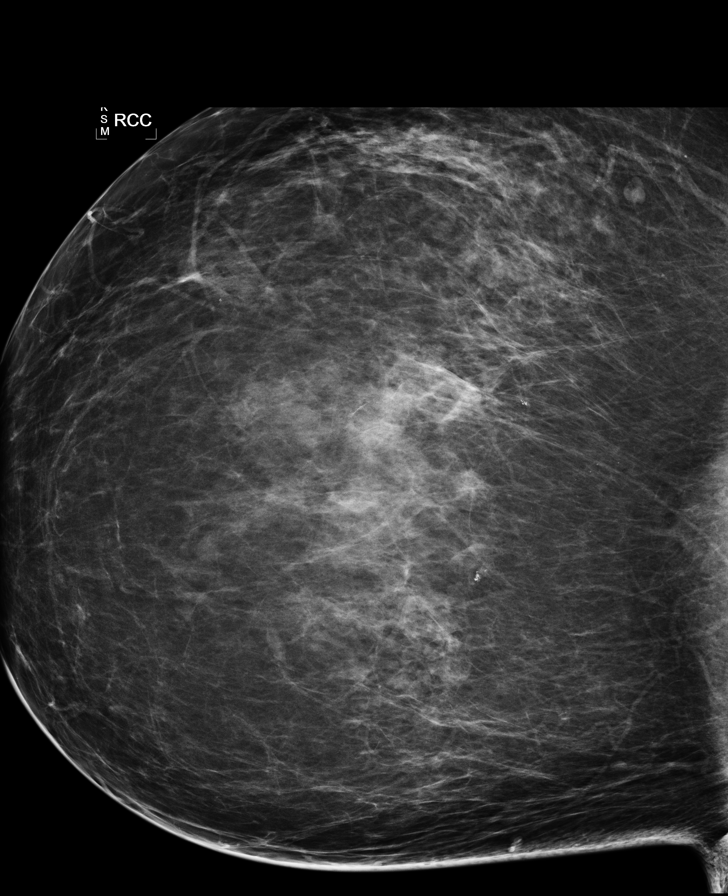

[L CC]
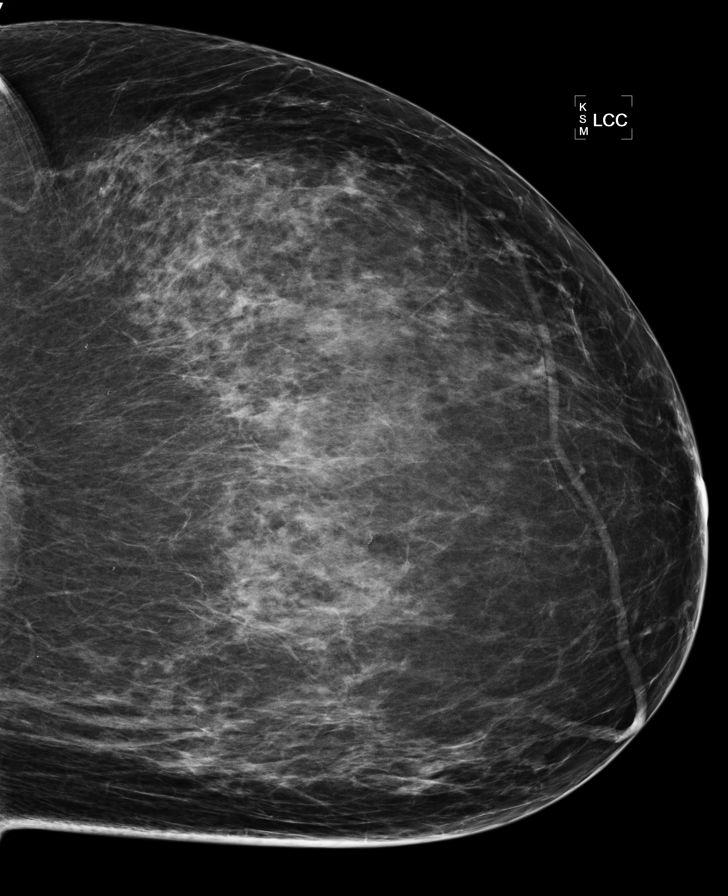

[L MLO (1 of 2)]
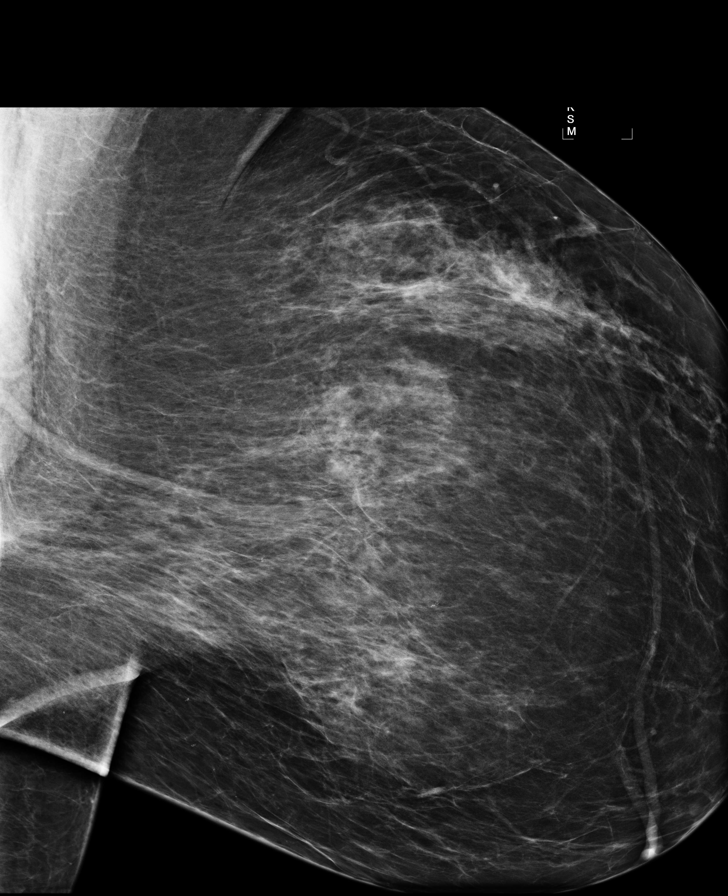

[R MLO (1 of 3)]
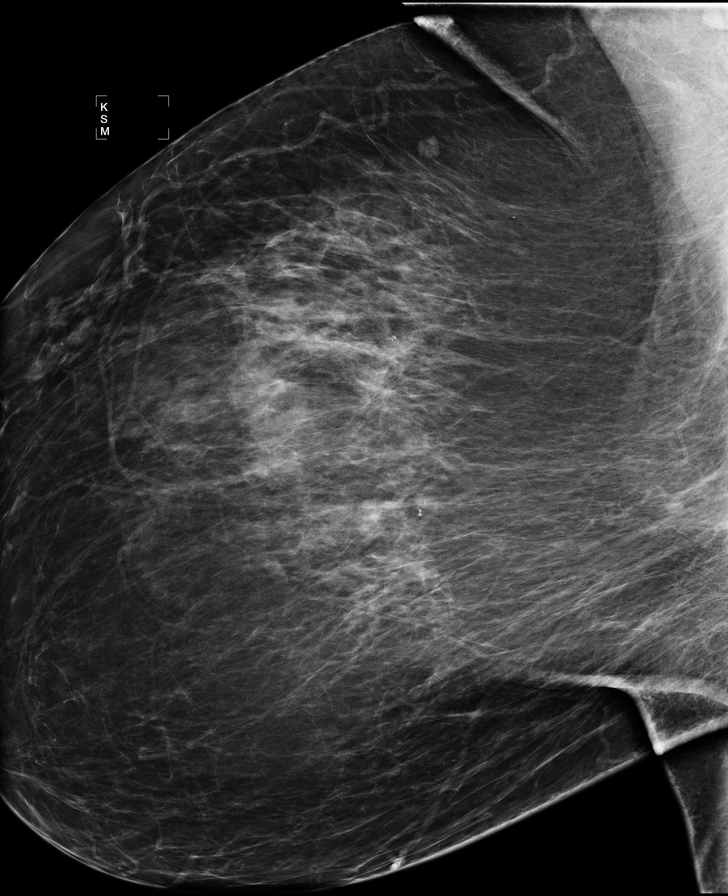

[R CC (2 of 2)]
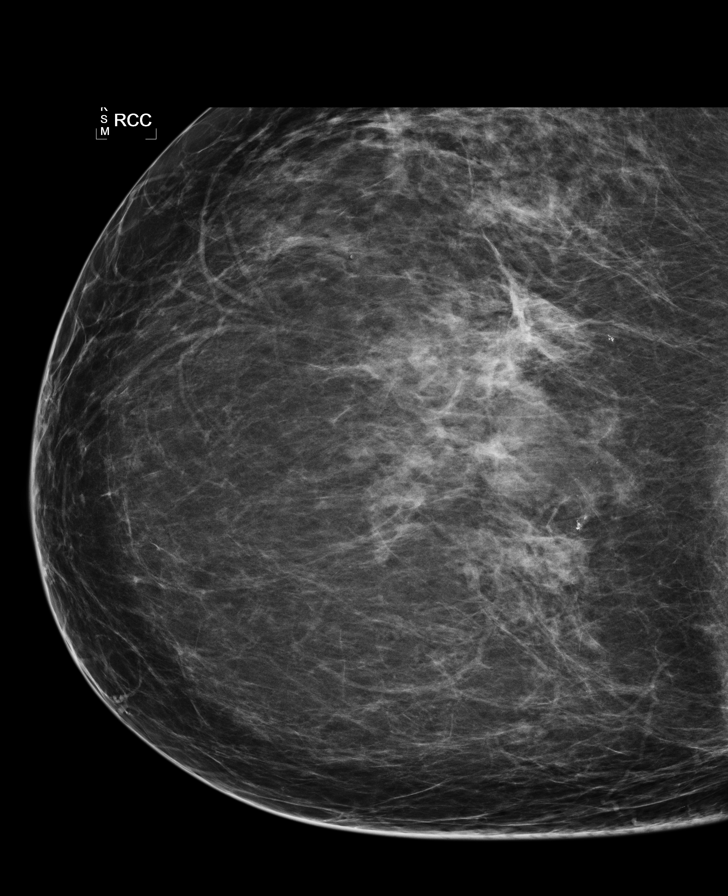

[R MLO (2 of 3)]
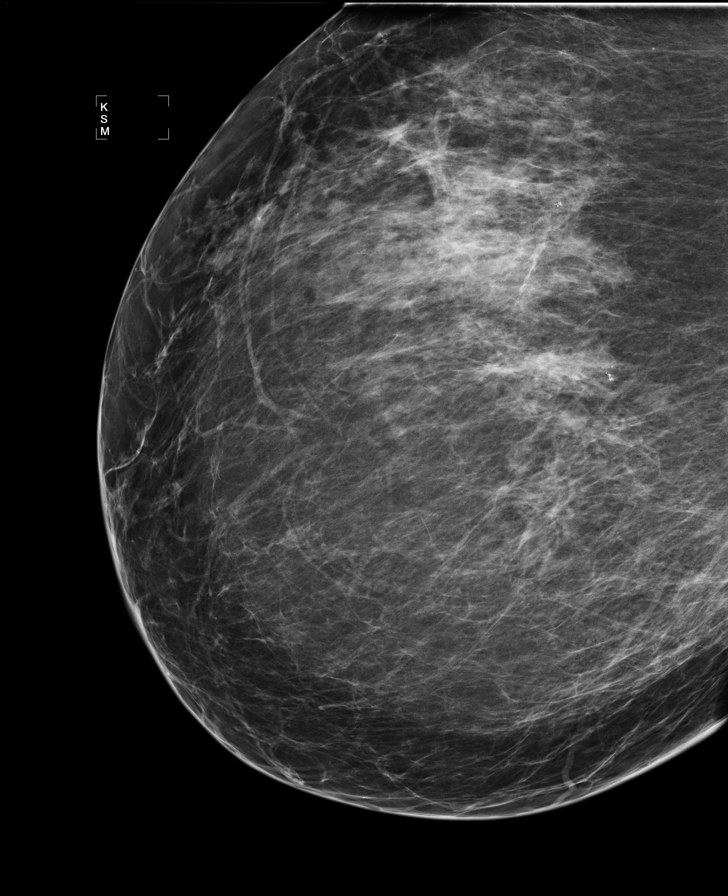

[L MLO (2 of 2)]
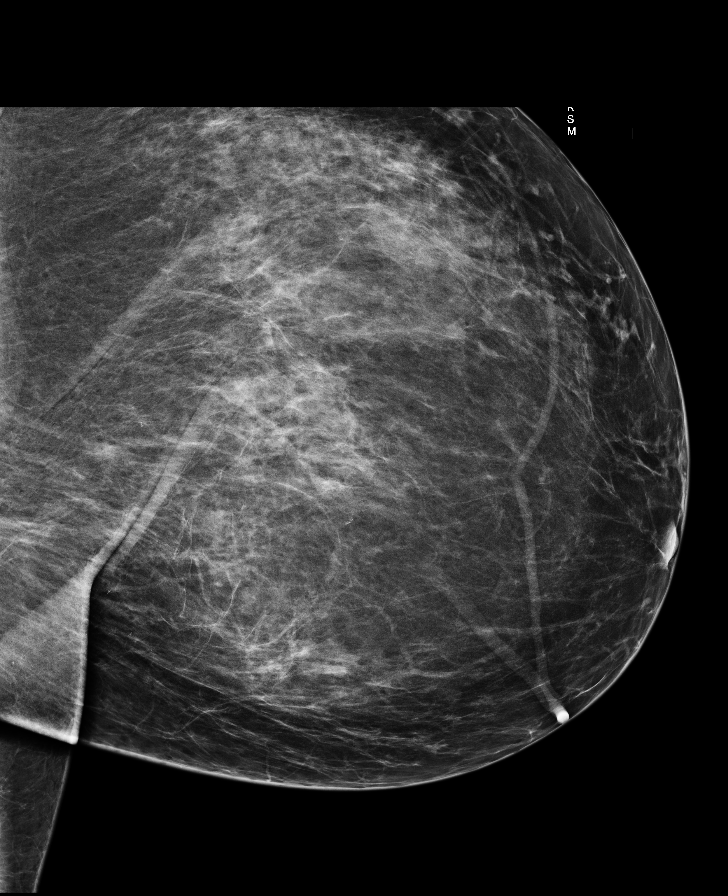

[R MLO (3 of 3)]
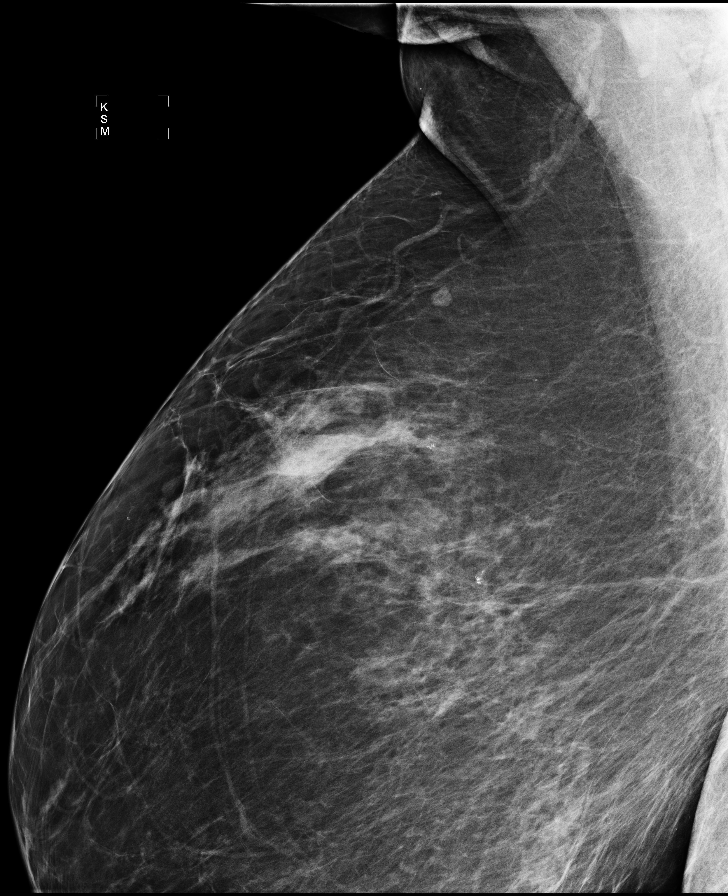

[8 of 8 positions shown; findings below may reference images not displayed]

FINDINGS: ACR Breast Density Category 2: There is a scattered fibroglandular
pattern.

No suspicious masses, architectural distortion, or calcifications
are present.

Images were processed with CAD.
IMPRESSION: No mammographic evidence of malignancy.

A result letter of this screening mammogram will be mailed directly
to the patient.

RECOMMENDATION:
Screening mammogram in one year. (Code:LB-W-669)

BI-RADS CATEGORY 1:  Negative.

## 2014-09-15 ENCOUNTER — Encounter: Payer: Self-pay | Admitting: Family Medicine

## 2014-09-15 ENCOUNTER — Ambulatory Visit (INDEPENDENT_AMBULATORY_CARE_PROVIDER_SITE_OTHER): Payer: BLUE CROSS/BLUE SHIELD | Admitting: Family Medicine

## 2014-09-15 VITALS — BP 128/72 | HR 68 | Temp 98.6°F | Resp 14 | Ht 68.0 in | Wt 222.0 lb

## 2014-09-15 DIAGNOSIS — J040 Acute laryngitis: Secondary | ICD-10-CM | POA: Diagnosis not present

## 2014-09-15 DIAGNOSIS — J020 Streptococcal pharyngitis: Secondary | ICD-10-CM

## 2014-09-15 DIAGNOSIS — J4521 Mild intermittent asthma with (acute) exacerbation: Secondary | ICD-10-CM

## 2014-09-15 LAB — RAPID STREP SCREEN (MED CTR MEBANE ONLY): Streptococcus, Group A Screen (Direct): POSITIVE — AB

## 2014-09-15 MED ORDER — PREDNISONE 20 MG PO TABS: 20.0000 mg | ORAL_TABLET | Freq: Every day | ORAL | Status: DC

## 2014-09-15 MED ORDER — AMOXICILLIN 500 MG PO CAPS: 500.0000 mg | ORAL_CAPSULE | Freq: Two times a day (BID) | ORAL | Status: DC

## 2014-09-15 NOTE — Progress Notes (Signed)
Patient ID: Stephanie Wall, female   DOB: 08-Jul-1955, 60 y.o.   MRN: [id number]   Subjective:    Patient ID: Stephanie Wall, female    DOB: 1955/09/12, 59 y.o.   MRN: [id number]  Patient presents for Illness Pt here with non productive cough, mild sinus drainage, sore throat for past 4 days, no fever. +sick contacts with grand-daughters she was taking care of for past 2 weeks. Lost her voice yesterday.  Has also had increased wheezing, some SOB at night, used albuterol neb yesterday Taken Human resources officer D   Review Of Systems:  GEN- denies fatigue, fever, weight loss,weakness, recent illness HEENT- denies eye drainage, change in vision, nasal discharge, CVS- denies chest pain, palpitations RESP-+ SOB,+ cough, +wheeze ABD- denies N/V, change in stools, abd pain GU- denies dysuria, hematuria, dribbling, incontinence MSK- denies joint pain, muscle aches, injury Neuro- denies headache, dizziness, syncope, seizure activity       Objective:    BP 128/72 mmHg  Pulse 68  Temp(Src) 98.6 F (37 C) (Oral)  Resp 14  Ht 5\' 8"  (1.727 m)  Wt 222 lb (100.699 kg)  BMI 33.76 kg/m2  SpO2 98% GEN- NAD, alert and oriented x3 HEENT- PERRL, EOMI, non injected sclera, pink conjunctiva, MMM, oropharynx mild injection, TM clear bilat no effusion,  No  maxillary sinus tenderness, +  Nasal drainage  Neck- Supple, + LAD CVS- RRR, no murmur RESP-few scattered wheeze EXT- No edema Pulses- Radial 2+   Peak flow 300/ 375/350      Assessment & Plan:      Problem List Items Addressed This Visit    None    Visit Diagnoses    Strep pharyngitis    -  Primary    Positive Strep, amox x 10 days    Relevant Orders    Rapid Strep Screen    Asthma with acute exacerbation, mild intermittent        Prdnisone, albuterol, Mucinex with decongestant , supportive care for laryngitis    Relevant Medications    predniSONE (DELTASONE) 20 MG tablet    Laryngitis           Note: This dictation was prepared with  Dragon dictation along with smaller phrase technology. Any transcriptional errors that result from this process are unintentional.

## 2014-09-15 NOTE — Patient Instructions (Signed)
Take antibiotics as prescribed Prednisone Mucinex D or Sudafed Nasal saline rinses  F/U as previous

## 2015-04-23 ENCOUNTER — Ambulatory Visit (INDEPENDENT_AMBULATORY_CARE_PROVIDER_SITE_OTHER): Payer: BLUE CROSS/BLUE SHIELD | Admitting: Family Medicine

## 2015-04-23 ENCOUNTER — Encounter: Payer: Self-pay | Admitting: Family Medicine

## 2015-04-23 VITALS — BP 128/78 | HR 80 | Temp 98.6°F | Resp 16 | Ht 68.0 in | Wt 227.0 lb

## 2015-04-23 DIAGNOSIS — J209 Acute bronchitis, unspecified: Secondary | ICD-10-CM | POA: Diagnosis not present

## 2015-04-23 MED ORDER — PREDNISONE 20 MG PO TABS: 20.0000 mg | ORAL_TABLET | Freq: Every day | ORAL | Status: DC

## 2015-04-23 MED ORDER — ALBUTEROL SULFATE (2.5 MG/3ML) 0.083% IN NEBU: 2.5000 mg | INHALATION_SOLUTION | RESPIRATORY_TRACT | Status: DC | PRN

## 2015-04-23 MED ORDER — METHYLPREDNISOLONE ACETATE 40 MG/ML IJ SUSP
40.0000 mg | Freq: Once | INTRAMUSCULAR | Status: AC
  Administered 2015-04-23: 40 mg via INTRAMUSCULAR

## 2015-04-23 MED ORDER — BENZONATATE 100 MG PO CAPS: 100.0000 mg | ORAL_CAPSULE | Freq: Two times a day (BID) | ORAL | Status: DC | PRN

## 2015-04-23 MED ORDER — AZITHROMYCIN 250 MG PO TABS: ORAL_TABLET | ORAL | Status: DC

## 2015-04-23 NOTE — Progress Notes (Signed)
Patient ID: Stephanie Wall, female   DOB: February 09, 1956, 59 y.o.   MRN: [id number]   Subjective:    Patient ID: Stephanie Wall, female    DOB: October 12, 1955, 59 y.o.   MRN: [id number]  Patient presents for Illness  here with cough with production wheezing started a week ago. Initially started with allergy-like symptoms that she started taking her Allegra. For now she's been using her albuterol as well as her nebulizer as she felt tight in the chest.. She's not had any fever. She does have underlying asthma. +sinus drainage , OTC tylenol sinus taken    Review Of Systems:  GEN- denies fatigue, fever, weight loss,weakness, recent illness HEENT- denies eye drainage, change in vision, nasal discharge, CVS- denies chest pain, palpitations RESP- denies SOB,+ cough, +wheeze ABD- denies N/V, change in stools, abd pain GU- denies dysuria, hematuria, dribbling, incontinence MSK- denies joint pain, muscle aches, injury Neuro- denies headache, dizziness, syncope, seizure activity       Objective:    BP 128/78 mmHg  Pulse 80  Temp(Src) 98.6 F (37 C) (Oral)  Resp 16  Ht 5\' 8"  (1.727 m)  Wt 227 lb (102.967 kg)  BMI 34.52 kg/m2  SpO2 98% GEN- NAD, alert and oriented x3 HEENT- PERRL, EOMI, non injected sclera, pink conjunctiva, MMM, oropharynx mild injection, TM clear bilat no effusion, no  maxillary sinus tenderness, nares clear Neck- Supple, no LAD CVS- RRR, no murmur RESP-few scattered wheeze, normal WOB EXT- No edema Pulses- Radial 2+        Assessment & Plan:      Problem List Items Addressed This Visit    None    Visit Diagnoses    Acute bronchitis, unspecified organism    -  Primary    Depo Medrol, Steroid burst, antibiotics, Tessalon, mucinex, continue nebs    Relevant Medications    methylPREDNISolone acetate (DEPO-MEDROL) injection 40 mg (Completed)       Note: This dictation was prepared with Dragon dictation along with smaller phrase technology. Any transcriptional  errors that result from this process are unintentional.

## 2015-04-23 NOTE — Patient Instructions (Addendum)
Tessalon perrles for cough Antibiotics - start today prednisone start tomorrow Try Melatonin for sleep- follow instructions on box  Use breathing machine every 4 hours during the day- for the next 24 hours F/U as needed

## 2015-04-24 ENCOUNTER — Encounter: Payer: Self-pay | Admitting: Family Medicine

## 2015-05-26 ENCOUNTER — Encounter: Payer: Self-pay | Admitting: Obstetrics and Gynecology

## 2015-05-26 ENCOUNTER — Ambulatory Visit (INDEPENDENT_AMBULATORY_CARE_PROVIDER_SITE_OTHER): Payer: BLUE CROSS/BLUE SHIELD | Admitting: Obstetrics and Gynecology

## 2015-05-26 VITALS — BP 126/80 | Ht 67.0 in | Wt 227.0 lb

## 2015-05-26 DIAGNOSIS — K5900 Constipation, unspecified: Secondary | ICD-10-CM | POA: Diagnosis not present

## 2015-05-26 DIAGNOSIS — K5909 Other constipation: Secondary | ICD-10-CM | POA: Insufficient documentation

## 2015-05-26 MED ORDER — LUBIPROSTONE 8 MCG PO CAPS: 8.0000 ug | ORAL_CAPSULE | Freq: Two times a day (BID) | ORAL | Status: DC

## 2015-05-26 NOTE — Progress Notes (Signed)
Patient ID: Stephanie Wall, female   DOB: 1955-11-26, 60 y.o.   MRN: [id number]   [facility] Visit  Patient name: Stephanie Wall MRN [id number]  Date of birth: 05/05/1955  CC & HPI:  Stephanie Wall is a 60 y.o. female presenting today for problems with BM's, cannot expel, rocks , no enemas yet, Splints by holding post vag wall. Using Miralax q day, still hard bm,   ROS:  G2P2, svd x 2, small infants,one with forceps.(last one) Bladder: less sui than before. Pt doesn't like taking meds.  Pertinent History Reviewed:   Reviewed: Significant for  Medical         Past Medical History  Diagnosis Date  . Abnormal glandular Papanicolaou smear of cervix   . Cervicalgia   . Obesity, unspecified   . Fibroids     uterus  . ALLERGIC RHINITIS   . Back pain   . Wears glasses   . Snores   . Allergy   . Asthma     allergy induced                              Surgical Hx:    Past Surgical History  Procedure Laterality Date  . Cholecystectomy  1976  . Breast cyst excision  1984 and 1997  . Colonoscopy    . Breast reduction surgery Bilateral 07/16/2012    Procedure: BILATERAL BREAST REDUCTION  (BREAST);  Surgeon: Charlene Brooke, MD;  Location: [facility];  Service: Plastics;  Laterality: Bilateral;   Medications: Reviewed & Updated - see associated section                       Current outpatient prescriptions:  .  albuterol (PROVENTIL) (2.5 MG/3ML) 0.083% nebulizer solution, Take 3 mLs (2.5 mg total) by nebulization every 4 (four) hours as needed for wheezing or shortness of breath., Disp: 75 mL, Rfl: 12 .  albuterol (VENTOLIN HFA) 108 (90 BASE) MCG/ACT inhaler, Inhale 2 puffs into the lungs every 4 (four) hours as needed for wheezing or shortness of breath (Rescue inhaler- if needed)., Disp: 1 Inhaler, Rfl: 6 .  beclomethasone (QVAR) 80 MCG/ACT inhaler, Inhale 2 puffs into the lungs 2 (two) times daily. Rinse mouth, Disp: 1 Inhaler, Rfl: 6 .  CALCIUM PO,  Take 1,200 mg by mouth daily., Disp: , Rfl:  .  fexofenadine-pseudoephedrine (ALLEGRA-D) 60-120 MG per tablet, Take 1 tablet by mouth 2 (two) times daily., Disp: 60 tablet, Rfl: 6 .  vitamin C (ASCORBIC ACID) 500 MG tablet, Take 500 mg by mouth daily. Reported on 05/26/2015, Disp: , Rfl:    Social History: Reviewed -  reports that she has never smoked. She has never used smokeless tobacco.  Objective Findings:  Vitals: Blood pressure 126/80, height 5\' 7"  (1.702 m), weight 227 lb (102.967 kg).  Physical Examination: General appearance - alert, well appearing, and in no distress, oriented to person, place, and time and normal appearing weight Mental status - alert, oriented to person, place, and time, normal mood, behavior, speech, dress, motor activity, and thought processes Eyes - pupils equal and reactive, extraocular eye movements intact Abdomen - soft, nontender, nondistended, no masses or organomegaly Pelvic - VULVA: normal appearing vulva with no masses, tenderness or lesions, very good support at introitus  VAGINA: normal appearing vagina with normal color and discharge, no lesions, PELVIC FLOOR EXAM: rectocele small, above a normal anal sphincter  and small introitus , CERVIX: normal appearing cervix without discharge or lesions, UTERUS: uterus is normal size, shape, consistency and nontender, ADNEXA: normal adnexa in size, nontender and no masses, RECTAL: rectocele noted small above a good sphincter, exam chaperoned by husb, permission asked.   Assessment & Plan:   A:  1. Chronic constipation, not compliant with stool softeners. 2 Small rectocele defect, will consider repair if cannot improve bowel fxn with stool softener (miralax bid) and Amitiza   P:  1. Add amitiaza

## 2015-06-08 ENCOUNTER — Other Ambulatory Visit: Payer: BLUE CROSS/BLUE SHIELD

## 2015-06-08 ENCOUNTER — Other Ambulatory Visit: Payer: Self-pay

## 2015-06-08 DIAGNOSIS — Z1231 Encounter for screening mammogram for malignant neoplasm of breast: Secondary | ICD-10-CM

## 2015-06-08 DIAGNOSIS — E669 Obesity, unspecified: Secondary | ICD-10-CM

## 2015-06-08 DIAGNOSIS — Z79899 Other long term (current) drug therapy: Secondary | ICD-10-CM

## 2015-06-08 DIAGNOSIS — E559 Vitamin D deficiency, unspecified: Secondary | ICD-10-CM

## 2015-06-08 DIAGNOSIS — Z0000 Encounter for general adult medical examination without abnormal findings: Secondary | ICD-10-CM

## 2015-06-08 LAB — CBC WITH DIFFERENTIAL/PLATELET
Basophils Absolute: 0 10*3/uL (ref 0.0–0.1)
Basophils Relative: 0 % (ref 0–1)
Eosinophils Absolute: 0.1 10*3/uL (ref 0.0–0.7)
Eosinophils Relative: 3 % (ref 0–5)
HCT: 37.9 % (ref 36.0–46.0)
Hemoglobin: 12.2 g/dL (ref 12.0–15.0)
Lymphocytes Relative: 45 % (ref 12–46)
Lymphs Abs: 1.8 10*3/uL (ref 0.7–4.0)
MCH: 29.3 pg (ref 26.0–34.0)
MCHC: 32.2 g/dL (ref 30.0–36.0)
MCV: 91.1 fL (ref 78.0–100.0)
MPV: 10.2 fL (ref 8.6–12.4)
Monocytes Absolute: 0.3 10*3/uL (ref 0.1–1.0)
Monocytes Relative: 8 % (ref 3–12)
Neutro Abs: 1.7 10*3/uL (ref 1.7–7.7)
Neutrophils Relative %: 44 % (ref 43–77)
Platelets: 254 10*3/uL (ref 150–400)
RBC: 4.16 MIL/uL (ref 3.87–5.11)
RDW: 15.8 % — ABNORMAL HIGH (ref 11.5–15.5)
WBC: 3.9 10*3/uL — ABNORMAL LOW (ref 4.0–10.5)

## 2015-06-08 LAB — LIPID PANEL
Cholesterol: 169 mg/dL (ref 125–200)
HDL: 65 mg/dL (ref >=46)
LDL Cholesterol: 92 mg/dL (ref <130)
Total CHOL/HDL Ratio: 2.6 Ratio (ref <=5.0)
Triglycerides: 61 mg/dL (ref <150)
VLDL: 12 mg/dL (ref <30)

## 2015-06-08 LAB — COMPLETE METABOLIC PANEL WITH GFR
ALT: 17 U/L (ref 6–29)
AST: 17 U/L (ref 10–35)
Albumin: 3.8 g/dL (ref 3.6–5.1)
Alkaline Phosphatase: 57 U/L (ref 33–130)
BUN: 12 mg/dL (ref 7–25)
CO2: 26 mmol/L (ref 20–31)
Calcium: 9.4 mg/dL (ref 8.6–10.4)
Chloride: 104 mmol/L (ref 98–110)
Creat: 0.98 mg/dL (ref 0.50–0.99)
GFR, Est African American: 72 mL/min (ref >=60)
GFR, Est Non African American: 62 mL/min (ref >=60)
Glucose, Bld: 88 mg/dL (ref 70–99)
Potassium: 4.1 mmol/L (ref 3.5–5.3)
Sodium: 139 mmol/L (ref 135–146)
Total Bilirubin: 0.5 mg/dL (ref 0.2–1.2)
Total Protein: 6.7 g/dL (ref 6.1–8.1)

## 2015-06-08 LAB — TSH: TSH: 1.17 mIU/L

## 2015-06-09 LAB — VITAMIN D 25 HYDROXY (VIT D DEFICIENCY, FRACTURES): Vit D, 25-Hydroxy: 19 ng/mL — ABNORMAL LOW (ref 30–100)

## 2015-06-10 ENCOUNTER — Ambulatory Visit (INDEPENDENT_AMBULATORY_CARE_PROVIDER_SITE_OTHER): Payer: BLUE CROSS/BLUE SHIELD | Admitting: Family Medicine

## 2015-06-10 ENCOUNTER — Encounter: Payer: Self-pay | Admitting: Family Medicine

## 2015-06-10 VITALS — BP 128/80 | HR 74 | Temp 98.0°F | Resp 16 | Ht 67.0 in | Wt 230.0 lb

## 2015-06-10 DIAGNOSIS — J4520 Mild intermittent asthma, uncomplicated: Secondary | ICD-10-CM | POA: Diagnosis not present

## 2015-06-10 DIAGNOSIS — E669 Obesity, unspecified: Secondary | ICD-10-CM

## 2015-06-10 DIAGNOSIS — K5900 Constipation, unspecified: Secondary | ICD-10-CM

## 2015-06-10 DIAGNOSIS — E559 Vitamin D deficiency, unspecified: Secondary | ICD-10-CM | POA: Diagnosis not present

## 2015-06-10 DIAGNOSIS — Z0000 Encounter for general adult medical examination without abnormal findings: Secondary | ICD-10-CM

## 2015-06-10 DIAGNOSIS — Z124 Encounter for screening for malignant neoplasm of cervix: Secondary | ICD-10-CM | POA: Diagnosis not present

## 2015-06-10 DIAGNOSIS — S161XXD Strain of muscle, fascia and tendon at neck level, subsequent encounter: Secondary | ICD-10-CM | POA: Diagnosis not present

## 2015-06-10 DIAGNOSIS — K5909 Other constipation: Secondary | ICD-10-CM

## 2015-06-10 MED ORDER — VITAMIN D (ERGOCALCIFEROL) 1.25 MG (50000 UNIT) PO CAPS
50000.0000 [IU] | ORAL_CAPSULE | ORAL | Status: DC
Start: 2015-06-10 — End: 2016-05-06

## 2015-06-10 MED ORDER — LUBIPROSTONE 24 MCG PO CAPS: 24.0000 ug | ORAL_CAPSULE | Freq: Two times a day (BID) | ORAL | Status: DC

## 2015-06-10 NOTE — Patient Instructions (Addendum)
Take the vitamin D prescribed for 3 months Then take at least 1000IU once a day of Vitamin D  Keep up with weight loss  We will send letter with PAP Smear results  F/U 1 year or as needed

## 2015-06-10 NOTE — Assessment & Plan Note (Signed)
Increase amitiza to 16mcg BID, given samples from office

## 2015-06-10 NOTE — Assessment & Plan Note (Signed)
Doing well, records show PNA vaccine UTD

## 2015-06-10 NOTE — Assessment & Plan Note (Signed)
CPE done, declines shingles, PAP Smear done, history of abnormal  Reviewed labs

## 2015-06-10 NOTE — Progress Notes (Signed)
Patient ID: Stephanie Wall, female   DOB: [birth year], 60 y.o.   MRN: [id number]   Subjective:    Patient ID: Stephanie Wall, female    DOB: 1955/11/07, 60 y.o.   MRN: [id number]  Patient presents for CPE with PAP and R Sided Neck Strain  patient for complete physical exam. Fasting labs also reviewed-notable for low vitamin D Colonoscopy is up-to-date and is due in 2018 she is due for mammogram next month. Due for Pap smear today, she has remote history of abnormal Pap smear She is due for shingles vaccine- Declines  Tetanus is up-to-date flu is up-to-date She has history of asthma- doing well with meds  She's had difficulty with constipation for many years however this worsen the past few months. She tried multiple over-the-counter laxatives with no improvement. She was seen by her GYN who states that she does have a mild rectocele but  Wanted to to treat her constipation fist . She was given Amitiza 8mg  BID which has helped minimally.  He was also seen by orthopedics walk-in clinic after she started working out at the gym she did a lot of weights and overhead bin didn't seem to class after where she has stiffness and pain in her neck. Diagnosis was cervical strain she was given meloxicam as well as Robaxin which has helped. She is back to working out, she still gets some soreness in her neck and spasm  Review Of Systems:  GEN- denies fatigue, fever, weight loss,weakness, recent illness HEENT- denies eye drainage, change in vision, nasal discharge, CVS- denies chest pain, palpitations RESP- denies SOB, cough, wheeze ABD- denies N/V, +change in stools, abd pain GU- denies dysuria, hematuria, dribbling, incontinence MSK- + joint pain, muscle aches, injury Neuro- denies headache, dizziness, syncope, seizure activity       Objective:    BP 128/80 mmHg  Pulse 74  Temp(Src) 98 F (36.7 C) (Oral)  Resp 16  Ht 5\' 7"  (1.702 m)  Wt 230 lb (104.327 kg)  BMI 36.01 kg/m2 GEN- NAD, alert  and oriented x3 HEENT- PERRL, EOMI, non injected sclera, pink conjunctiva, MMM, oropharynx clear Neck- Supple, no thyromegaly, +spasm Trapezius, good ROM  Breast- normal symmetry, no nipple inversion,no nipple drainage, no nodules or lumps felt Nodes- no axillary nodes GU- normal external genitalia, vaginal mucosa pink and moist, cervix visualized no growth, no blood form os, minimal thin clear discharge, no CMT, no ovarian masses, uterus normal size CVS- RRR, no murmur RESP-CTAB ABD-NABS,soft,NT,ND EXT- No edema Pulses- Radial, DP- 2+        Assessment & Plan:      Problem List Items Addressed This Visit    Vitamin D deficiency   Routine general medical examination at a health care facility - Primary    CPE done, declines shingles, PAP Smear done, history of abnormal  Reviewed labs      Obesity (BMI 30-39.9)   Constipation, chronic    Increase amitiza to 32mcg BID, given samples from office       Allergy-induced asthma    Doing well, records show PNA vaccine UTD       Other Visit Diagnoses    Cervical cancer screening        Relevant Orders    PAP, ThinPrep ASCUS Rflx HPV Rflx Type    Neck strain, subsequent encounter        recommend massage, heat to neck, continue robaxin at bedtime, NSAIDS as needed, already much improved  Note: This dictation was prepared with Dragon dictation along with smaller phrase technology. Any transcriptional errors that result from this process are unintentional.

## 2015-06-11 LAB — PAP THINPREP ASCUS RFLX HPV RFLX TYPE

## 2015-06-12 ENCOUNTER — Encounter: Payer: Self-pay | Admitting: Family Medicine

## 2015-06-18 ENCOUNTER — Other Ambulatory Visit: Payer: Self-pay | Admitting: Family Medicine

## 2015-06-18 NOTE — Telephone Encounter (Signed)
Medication refilled per protocol. 

## 2015-07-07 ENCOUNTER — Ambulatory Visit: Payer: BLUE CROSS/BLUE SHIELD | Admitting: Obstetrics and Gynecology

## 2015-07-09 ENCOUNTER — Ambulatory Visit
Admission: RE | Admit: 2015-07-09 | Discharge: 2015-07-09 | Disposition: A | Discharge status: Home / Self Care | Payer: BLUE CROSS/BLUE SHIELD | Source: Ambulatory Visit

## 2015-07-09 DIAGNOSIS — Z1231 Encounter for screening mammogram for malignant neoplasm of breast: Secondary | ICD-10-CM

## 2015-07-15 ENCOUNTER — Other Ambulatory Visit: Payer: Self-pay | Admitting: Family Medicine

## 2015-07-15 NOTE — Telephone Encounter (Signed)
Refill appropriate and filled per protocol. 

## 2015-08-18 ENCOUNTER — Other Ambulatory Visit: Payer: Self-pay | Admitting: Family Medicine

## 2015-10-20 ENCOUNTER — Ambulatory Visit (INDEPENDENT_AMBULATORY_CARE_PROVIDER_SITE_OTHER): Payer: BLUE CROSS/BLUE SHIELD | Admitting: Family Medicine

## 2015-10-20 ENCOUNTER — Ambulatory Visit
Admission: RE | Admit: 2015-10-20 | Discharge: 2015-10-20 | Disposition: A | Discharge status: Home / Self Care | Payer: BLUE CROSS/BLUE SHIELD | Source: Ambulatory Visit | Attending: Family Medicine | Admitting: Family Medicine

## 2015-10-20 ENCOUNTER — Other Ambulatory Visit: Payer: Self-pay | Admitting: *Deleted

## 2015-10-20 ENCOUNTER — Encounter: Payer: Self-pay | Admitting: Family Medicine

## 2015-10-20 VITALS — BP 128/62 | HR 72 | Temp 98.6°F | Resp 14 | Ht 67.0 in | Wt 226.0 lb

## 2015-10-20 DIAGNOSIS — M542 Cervicalgia: Secondary | ICD-10-CM

## 2015-10-20 DIAGNOSIS — R0989 Other specified symptoms and signs involving the circulatory and respiratory systems: Secondary | ICD-10-CM | POA: Diagnosis not present

## 2015-10-20 DIAGNOSIS — J984 Other disorders of lung: Secondary | ICD-10-CM

## 2015-10-20 NOTE — Progress Notes (Signed)
Patient ID: Stephanie Wall, female   DOB: 1956/02/02, 60 y.o.   MRN: [id number]    Subjective:    Patient ID: Stephanie Wall, female    DOB: [birth year], 60 y.o.   MRN: [id number]  Patient presents for Knot to R Side of Neck Patient here with knot to the right side of her neck. This occurred after she ate some fish she feels like there is something poking her and there is a small knot there. This occurred on Saturday. She denies any difficulty swallowing or difficulty breathing. She did not have any sore throat.  She has chronic neck pain she was seen by an orthopedic walk-in clinic back in February that time they diagnosed her which is cervical strain after exercising. She continues to have pain that radiates down her cervical spine into the upper thoracic region is worse after exercise. She denies any tingling or numbness in her fingertips she feels that she does get swelling on and off at the top of her neck.    Review Of Systems:  GEN- denies fatigue, fever, weight loss,weakness, recent illness HEENT- denies eye drainage, change in vision, nasal discharge, CVS- denies chest pain, palpitations RESP- denies SOB, cough, wheeze ABD- denies N/V, change in stools, abd pain GU- denies dysuria, hematuria, dribbling, incontinence MSK-+joint pain, muscle aches, injury Neuro- denies headache, dizziness, syncope, seizure activity       Objective:    BP 128/62 mmHg  Pulse 72  Temp(Src) 98.6 F (37 C) (Oral)  Resp 14  Ht 5\' 7"  (1.702 m)  Wt 226 lb (102.513 kg)  BMI 35.39 kg/m2 GEN- NAD, alert and oriented x3 HEENT- PERRL, EOMI, non injected sclera, pink conjunctiva, MMM, oropharynx clear Neck- Supple, no thyromegaly, ? smallsoft tissue swelling palpated right neck at border of SCM  fair ROM neck, TTP along trapeizus, neg spurlings  CVS- RRR, no murmur RESP-CTAB         Assessment & Plan:      Problem List Items Addressed This Visit    None    Visit Diagnoses    Foreign  body sensation in throat    -  Primary    possible small fish bone, obtain lateral soft tissue xray, advised they often pass on their own and difficult to see on xray     Relevant Orders    DG Neck Soft Tissue    Neck pain        concern for DDD, worsening neck pain over the past few months with spasm, recommend topical rubs obtain xray, oral NSAIDS as needed. Other differential early spinal stenosis    Relevant Orders    DG Cervical Spine Complete       Note: This dictation was prepared with Dragon dictation along with smaller phrase technology. Any transcriptional errors that result from this process are unintentional.

## 2015-10-20 NOTE — Patient Instructions (Signed)
[facility]  We will call with results  F/U pending results

## 2015-10-21 ENCOUNTER — Ambulatory Visit
Admission: RE | Admit: 2015-10-21 | Discharge: 2015-10-21 | Disposition: A | Discharge status: Home / Self Care | Payer: BLUE CROSS/BLUE SHIELD | Source: Ambulatory Visit | Attending: Family Medicine | Admitting: Family Medicine

## 2015-10-21 DIAGNOSIS — J984 Other disorders of lung: Secondary | ICD-10-CM

## 2015-11-03 ENCOUNTER — Encounter: Payer: Self-pay | Admitting: Family Medicine

## 2015-12-03 ENCOUNTER — Encounter: Payer: Self-pay | Admitting: Family Medicine

## 2016-04-12 ENCOUNTER — Encounter (HOSPITAL_COMMUNITY): Payer: Self-pay | Admitting: *Deleted

## 2016-04-12 ENCOUNTER — Ambulatory Visit: Payer: BLUE CROSS/BLUE SHIELD | Admitting: Family Medicine

## 2016-04-12 DIAGNOSIS — S29012A Strain of muscle and tendon of back wall of thorax, initial encounter: Secondary | ICD-10-CM | POA: Insufficient documentation

## 2016-04-12 DIAGNOSIS — J45909 Unspecified asthma, uncomplicated: Secondary | ICD-10-CM | POA: Insufficient documentation

## 2016-04-12 DIAGNOSIS — Y939 Activity, unspecified: Secondary | ICD-10-CM | POA: Insufficient documentation

## 2016-04-12 DIAGNOSIS — Y929 Unspecified place or not applicable: Secondary | ICD-10-CM | POA: Diagnosis not present

## 2016-04-12 DIAGNOSIS — X501XXA Overexertion from prolonged static or awkward postures, initial encounter: Secondary | ICD-10-CM | POA: Insufficient documentation

## 2016-04-12 DIAGNOSIS — Z79899 Other long term (current) drug therapy: Secondary | ICD-10-CM | POA: Diagnosis not present

## 2016-04-12 DIAGNOSIS — Y999 Unspecified external cause status: Secondary | ICD-10-CM | POA: Insufficient documentation

## 2016-04-12 DIAGNOSIS — S299XXA Unspecified injury of thorax, initial encounter: Secondary | ICD-10-CM | POA: Diagnosis present

## 2016-04-12 NOTE — ED Triage Notes (Signed)
The pt has  Spine problem and since Thursday her pain has been worse she has the pain in her thorac spine.  The pain is causing some numbness in her lt arm..  She saw her doctor today who did an xray and gave her flexeril and prednisone  She also tood vicodin x 2 for the pain which has not helped

## 2016-04-13 ENCOUNTER — Emergency Department (HOSPITAL_COMMUNITY): Payer: BLUE CROSS/BLUE SHIELD

## 2016-04-13 ENCOUNTER — Encounter (HOSPITAL_COMMUNITY): Payer: Self-pay | Admitting: *Deleted

## 2016-04-13 ENCOUNTER — Emergency Department (HOSPITAL_COMMUNITY)
Admission: EM | Admit: 2016-04-13 | Discharge: 2016-04-13 | Disposition: A | Discharge status: Home / Self Care | Payer: BLUE CROSS/BLUE SHIELD | Attending: Emergency Medicine | Admitting: Emergency Medicine

## 2016-04-13 DIAGNOSIS — T148XXA Other injury of unspecified body region, initial encounter: Secondary | ICD-10-CM

## 2016-04-13 DIAGNOSIS — M549 Dorsalgia, unspecified: Secondary | ICD-10-CM

## 2016-04-13 MED ORDER — DIPHENHYDRAMINE HCL 50 MG/ML IJ SOLN
25.0000 mg | Freq: Once | INTRAMUSCULAR | Status: AC
  Administered 2016-04-13: 25 mg via INTRAVENOUS
  Filled 2016-04-13: qty 1

## 2016-04-13 MED ORDER — KETOROLAC TROMETHAMINE 30 MG/ML IJ SOLN
30.0000 mg | Freq: Once | INTRAMUSCULAR | Status: AC
  Administered 2016-04-13: 30 mg via INTRAVENOUS
  Filled 2016-04-13: qty 1

## 2016-04-13 MED ORDER — MORPHINE SULFATE (PF) 4 MG/ML IV SOLN
4.0000 mg | Freq: Once | INTRAVENOUS | Status: AC
  Administered 2016-04-13: 4 mg via INTRAVENOUS
  Filled 2016-04-13: qty 1

## 2016-04-13 MED ORDER — DEXTROSE 5 % IV SOLN
1000.0000 mg | Freq: Once | INTRAVENOUS | Status: AC
  Administered 2016-04-13: 1000 mg via INTRAVENOUS
  Filled 2016-04-13: qty 10

## 2016-04-13 MED ORDER — NAPROXEN 500 MG PO TABS: 500.0000 mg | ORAL_TABLET | Freq: Two times a day (BID) | ORAL | 0 refills | Status: DC

## 2016-04-13 MED ORDER — METHOCARBAMOL 500 MG PO TABS: 500.0000 mg | ORAL_TABLET | Freq: Two times a day (BID) | ORAL | 0 refills | Status: DC

## 2016-04-13 NOTE — Discharge Instructions (Addendum)
Return to the ED with any concerns including weakness of arms or legs, not able to urinate, loss of control of bowel or bladder, decreased level of alertness/lethargy, or any other alarming symptoms

## 2016-04-13 NOTE — ED Notes (Signed)
Patient sent to MRI

## 2016-04-13 NOTE — ED Provider Notes (Signed)
[facility] DEPT Provider Note   CSN: [id number] Arrival date & time: 04/12/16  2141  By signing my name below, I, Macon Large, attest that this documentation has been prepared under the direction and in the presence of Delora Fuel, MD. Electronically Signed: Macon Large, ED Scribe. 04/13/16. 1:18 AM.  History   Chief Complaint Chief Complaint  Patient presents with  . Back Pain   The history is provided by the patient. No language interpreter was used.   HPI Comments: Stephanie Wall is a 60 y.o. female with PMHx of scoliosis, who presents to the Emergency Department complaining of moderate, constant, back pain onset a week ago. Pt states she was at a Zumba class and notes she began to have mid back pain afterwards. Pt also reports neck pain that radiates to her left shoulder and mid chest accompanied by numbness and soreness. She states she bent down in her kitchen and slammed her left side of her body on a nearby wall. Pt notes her back pain was worsened after the incident. Pt also reports associated tenderness to her mid back. She notes taking ibuprofen for her back pain 6 days ago with minimal relief. She notes her back pain has worsened since yesterday. Pt states she went to an appointment for her back pain earlier today and states she was prescribed Flexeril and Prednisone. Pt reports taking her medication at ~11am and back pain was not relieved after a nap. Pt notes Dr. Buelah Manis is her PCP. Pt denies neck stiffness, swelling and fever.   Past Medical History:  Diagnosis Date  . Abnormal glandular Papanicolaou smear of cervix   . ALLERGIC RHINITIS   . Allergy   . Asthma    allergy induced  . Back pain   . Cervicalgia   . Fibroids    uterus  . Obesity, unspecified   . Snores   . Wears glasses     Patient Active Problem List   Diagnosis Date Noted  . Vitamin D deficiency 06/10/2015  . Constipation, chronic 05/26/2015  . Allergy-induced asthma 05/30/2014  .  Routine general medical examination at a health care facility 07/01/2013  . Obesity (BMI 30-39.9) 07/01/2013  . ALLERGIC RHINITIS CAUSE UNSPECIFIED 01/25/2009  . FIBROIDS, UTERUS 01/21/2009  . PAP SMEAR, ABNORMAL 01/21/2009    Past Surgical History:  Procedure Laterality Date  . BREAST CYST EXCISION  1984 and 1997  . BREAST REDUCTION SURGERY Bilateral 07/16/2012   Procedure: BILATERAL BREAST REDUCTION  (BREAST);  Surgeon: Charlene Brooke, MD;  Location: [facility];  Service: Plastics;  Laterality: Bilateral;  . CHOLECYSTECTOMY  1976  . COLONOSCOPY      OB History    No data available       Home Medications    Prior to Admission medications   Medication Sig Start Date End Date Taking? Authorizing Provider  albuterol (PROVENTIL) (2.5 MG/3ML) 0.083% nebulizer solution Take 3 mLs (2.5 mg total) by nebulization every 4 (four) hours as needed for wheezing or shortness of breath. 04/23/15   Alycia Rossetti, MD  CALCIUM PO Take 1,200 mg by mouth daily.    Historical Provider, MD  fexofenadine-pseudoephedrine (ALLEGRA-D) 60-120 MG per tablet Take 1 tablet by mouth 2 (two) times daily. 06/03/14   Alycia Rossetti, MD  QVAR 80 MCG/ACT inhaler INHALE 2 PUFFS BY MOUTH TWICE DAILY. RINSE MOUTH 06/18/15   Alycia Rossetti, MD  VENTOLIN HFA 108 [phone] Base) MCG/ACT inhaler INHALE 2 PUFFS BY MOUTH EVERY 4 HOURS  AS NEEDED FOR WHEEZING OR SHORTNESS OF BREATH. 07/15/15   Alycia Rossetti, MD  Vitamin D, Ergocalciferol, (DRISDOL) 50000 units CAPS capsule Take 1 capsule (50,000 Units total) by mouth every 7 (seven) days. 06/10/15   Alycia Rossetti, MD    Family History Family History  Problem Relation Age of Onset  . Hypertension Mother   . Birth defects Mother   . Hypertension Father   . Arthritis Maternal Grandmother   . Cancer Maternal Grandmother 53    Breast Cancer  . Hypertension Maternal Grandmother   . Heart disease Maternal Grandmother   . Diabetes Maternal Grandmother     . Thyroid disease Maternal Grandmother   . Heart disease Maternal Grandfather   . Arthritis Paternal Grandmother   . Kidney disease Paternal Grandmother   . Hypertension Paternal Grandmother   . Stroke Paternal Grandfather   . HIV Sister   . Heart disease Paternal Aunt     CHF    Social History Social History  Substance Use Topics  . Smoking status: Never Smoker  . Smokeless tobacco: Never Used  . Alcohol use No     Allergies   Demerol and Dilaudid [hydromorphone hcl]   Review of Systems Review of Systems  Constitutional: Negative for fever.  Musculoskeletal: Positive for back pain, myalgias (left shoulder) and neck pain. Negative for joint swelling and neck stiffness.  Neurological: Positive for numbness.  All other systems reviewed and are negative.    Physical Exam Updated Vital Signs BP 145/95   Pulse 61   Temp 98.4 F (36.9 C)   Resp 18   Ht 5\' 7"  (1.702 m)   Wt 230 lb (104.3 kg)   SpO2 100%   BMI 36.02 kg/m   Physical Exam  Constitutional: She is oriented to person, place, and time. She appears well-developed and well-nourished.  HENT:  Head: Normocephalic and atraumatic.  Eyes: EOM are normal. Pupils are equal, round, and reactive to light.  Neck: Normal range of motion. Neck supple. No JVD present.  Cardiovascular: Normal rate, regular rhythm and normal heart sounds.   No murmur heard. Pulmonary/Chest: Effort normal and breath sounds normal. She has no wheezes. She has no rales. She exhibits no tenderness.  Abdominal: Soft. Bowel sounds are normal. She exhibits no distension and no mass. There is no tenderness.  Musculoskeletal: Normal range of motion. She exhibits tenderness. She exhibits no edema.  tender upper thoracic spin with moderate left paraspinal spasm and tenderness.   Lymphadenopathy:    She has no cervical adenopathy.  Neurological: She is alert and oriented to person, place, and time. No cranial nerve deficit. She exhibits normal  muscle tone. Coordination normal.  Strength 5/5 in both arms. Slight decrease sensation to left arm compared to the right,   Skin: Skin is warm and dry. No rash noted.  Psychiatric: She has a normal mood and affect. Her behavior is normal. Judgment and thought content normal.  Nursing note and vitals reviewed.    ED Treatments / Results   DIAGNOSTIC STUDIES: Oxygen Saturation is 98% on RA, normal by my interpretation.    COORDINATION OF CARE: 1:18 AM Discussed treatment plan with pt at bedside which includes pain medication and pt agreed to plan.   Radiology No results found.  Procedures Procedures (including critical care time)  Medications Ordered in ED Medications  ketorolac (TORADOL) 30 MG/ML injection 30 mg (30 mg Intravenous Given 04/13/16 0139)  methocarbamol (ROBAXIN) 1,000 mg in dextrose 5 % 50 mL  IVPB (0 mg Intravenous Stopped 04/13/16 0209)     Initial Impression / Assessment and Plan / ED Course  I have reviewed the triage vital signs and the nursing notes.  Pertinent imaging results that were available during my care of the patient were reviewed by me and considered in my medical decision making (see chart for details).  Clinical Course    Her back pain which seems clearly muscular based on exam. Failure to respond to cyclobenzaprine as an outpatient. IV is started and she is given methocarbamol intravenously as well as ketorolac. Following this, she states the pain is actually worse. MRI has been ordered. She is given a dose of morphine for pain. Case is signed out to Dr. Canary Brim to evaluate MRI results.  Final Clinical Impressions(s) / ED Diagnoses   Final diagnoses:  Upper back pain    New Prescriptions New Prescriptions   No medications on file    I personally performed the services described in this documentation, which was scribed in my presence. The recorded information has been reviewed and is accurate.       Delora Fuel, MD [phone] [id number]

## 2016-04-13 NOTE — ED Notes (Signed)
Received call from MRI stating patient started getting hives post morphine injection. MD acknowledged. This RN to walk to MRI and give benadryl. Patient stable, no SOB.

## 2016-04-15 ENCOUNTER — Ambulatory Visit: Payer: BLUE CROSS/BLUE SHIELD | Admitting: Family Medicine

## 2016-05-06 ENCOUNTER — Encounter: Payer: Self-pay | Admitting: Family Medicine

## 2016-05-06 ENCOUNTER — Ambulatory Visit (INDEPENDENT_AMBULATORY_CARE_PROVIDER_SITE_OTHER): Payer: BLUE CROSS/BLUE SHIELD | Admitting: Family Medicine

## 2016-05-06 VITALS — BP 128/88 | HR 82 | Temp 97.9°F | Resp 14 | Ht 67.0 in | Wt 230.0 lb

## 2016-05-06 DIAGNOSIS — R000 Tachycardia, unspecified: Secondary | ICD-10-CM

## 2016-05-06 DIAGNOSIS — G542 Cervical root disorders, not elsewhere classified: Secondary | ICD-10-CM

## 2016-05-06 LAB — TSH: TSH: 1.22 mIU/L

## 2016-05-06 LAB — COMPREHENSIVE METABOLIC PANEL
ALT: 18 U/L (ref 6–29)
AST: 19 U/L (ref 10–35)
Albumin: 3.7 g/dL (ref 3.6–5.1)
Alkaline Phosphatase: 56 U/L (ref 33–130)
BUN: 8 mg/dL (ref 7–25)
CO2: 28 mmol/L (ref 20–31)
Calcium: 9.1 mg/dL (ref 8.6–10.4)
Chloride: 107 mmol/L (ref 98–110)
Creat: 0.93 mg/dL (ref 0.50–0.99)
Glucose, Bld: 90 mg/dL (ref 70–99)
Potassium: 3.8 mmol/L (ref 3.5–5.3)
Sodium: 141 mmol/L (ref 135–146)
Total Bilirubin: 0.4 mg/dL (ref 0.2–1.2)
Total Protein: 6.6 g/dL (ref 6.1–8.1)

## 2016-05-06 LAB — CBC WITH DIFFERENTIAL/PLATELET
Basophils Absolute: 0 cells/uL (ref 0–200)
Basophils Relative: 0 %
Eosinophils Absolute: 148 cells/uL (ref 15–500)
Eosinophils Relative: 4 %
HCT: 38.1 % (ref 35.0–45.0)
Hemoglobin: 11.9 g/dL — ABNORMAL LOW (ref 12.0–15.0)
Lymphocytes Relative: 37 %
Lymphs Abs: 1369 cells/uL (ref 850–3900)
MCH: 28.8 pg (ref 27.0–33.0)
MCHC: 31.2 g/dL — ABNORMAL LOW (ref 32.0–36.0)
MCV: 92.3 fL (ref 80.0–100.0)
MPV: 9.7 fL (ref 7.5–12.5)
Monocytes Absolute: 370 cells/uL (ref 200–950)
Monocytes Relative: 10 %
Neutro Abs: 1813 cells/uL (ref 1500–7800)
Neutrophils Relative %: 49 %
Platelets: 260 10*3/uL (ref 140–400)
RBC: 4.13 MIL/uL (ref 3.80–5.10)
RDW: 15 % (ref 11.0–15.0)
WBC: 3.7 10*3/uL — ABNORMAL LOW (ref 3.8–10.8)

## 2016-05-06 MED ORDER — BECLOMETHASONE DIPROPIONATE 80 MCG/ACT IN AERS: INHALATION_SPRAY | RESPIRATORY_TRACT | 5 refills | Status: DC

## 2016-05-06 MED ORDER — ALBUTEROL SULFATE HFA 108 (90 BASE) MCG/ACT IN AERS: INHALATION_SPRAY | RESPIRATORY_TRACT | 11 refills | Status: DC

## 2016-05-06 NOTE — Progress Notes (Signed)
   Subjective:    Patient ID: Stephanie Wall, female    DOB: [birth year], 61 y.o.   MRN: [id number]  Patient presents for Back Pain (x1 month- has been seen in ERx2 for pain- MRI completed) and Other (states that since she has been dealing with back pain, she hasn't felt like herself- hand tingling, dizzy, increased BP/HR)  Patient here to follow-up ER in neurosurgery. She went to the emergency room after having severe upper back and neck pain after exercising. A thoracic MRI was done which was unremarkable. She went to see Dr. Alfonso Ramus who didn't cervical MRI which did show some spondylosis of the cervical region as well as some nerve root impingement at C6-6-7 she had some numbness into her left arm. She was given Medrol Dosepak that has now resolved. During the Mr. that she states that her blood pressure would elevate some especially when she was in pain. On Sunday she was noted that her heart rate was in the 120s. She was taking all the different medications for the pain as well. She is now discontinued those medications and states that she is not having problems with her blood pressure or her heart rate she's not had any further dizzy episodes. She never had any actual chest pain or shortness of breath.   Review Of Systems:  GEN- denies fatigue, fever, weight loss,weakness, recent illness HEENT- denies eye drainage, change in vision, nasal discharge, CVS- denies chest pain, +palpitations RESP- denies SOB, cough, wheeze ABD- denies N/V, change in stools, abd pain GU- denies dysuria, hematuria, dribbling, incontinence MSK- + joint pain, muscle aches, injury Neuro- denies headache, dizziness, syncope, seizure activity       Objective:    BP 128/88 (BP Location: Left Arm, Patient Position: Sitting, Cuff Size: Large)   Pulse 82   Temp 97.9 F (36.6 C) (Oral)   Resp 14   Ht 5\' 7"  (1.702 m)   Wt 230 lb (104.3 kg)   SpO2 98%   BMI 36.02 kg/m  GEN- NAD, alert and oriented x3 HEENT- PERRL,  EOMI, non injected sclera, pink conjunctiva, MMM, oropharynx clear Neck- Supple, no thyromegaly, no bruit  CVS- RRR, no murmur RESP-CTAB EXT- No edema Pulses- Radial, DP- 2+   EKG-NSR, non specific t wave-flat in V4-V6 Orthostatics normal      Assessment & Plan:      Problem List Items Addressed This Visit    None    Visit Diagnoses    Tachycardia    -  Primary   May have been stress, pain, prednisone all contrubuting. Symptoms now resolved, exam normal. Check labs, TSH, if normal will just monitor Her symptoms for now she has recurrence of the tachycardic episodes will get her set up to have a heart monitor placed to see if we are missing some type of arrhythmia    Relevant Orders   EKG 12-Lead (Completed)   CBC with Differential/Platelet   Comprehensive metabolic panel   TSH   Cervical nerve root impingement       symptoms improved, next step is cervical injections per Dr. Alfonso Ramus      Note: This dictation was prepared with Dragon dictation along with smaller phrase technology. Any transcriptional errors that result from this process are unintentional.

## 2016-05-06 NOTE — Progress Notes (Signed)
Orthostatic Blood Pressure: Right arm, Large cuff  Lying: 124/ 62  Sitting: 128/ 66  Standing: 130/ 72

## 2016-05-06 NOTE — Patient Instructions (Addendum)
We will call with lab results No new medications  F/U pending results

## 2016-06-10 ENCOUNTER — Encounter: Payer: BLUE CROSS/BLUE SHIELD | Admitting: Family Medicine

## 2016-06-13 ENCOUNTER — Ambulatory Visit (INDEPENDENT_AMBULATORY_CARE_PROVIDER_SITE_OTHER): Payer: BLUE CROSS/BLUE SHIELD | Admitting: Family Medicine

## 2016-06-13 ENCOUNTER — Encounter: Payer: Self-pay | Admitting: Family Medicine

## 2016-06-13 VITALS — BP 136/72 | HR 70 | Temp 98.7°F | Resp 12 | Ht 67.0 in | Wt 234.0 lb

## 2016-06-13 DIAGNOSIS — N390 Urinary tract infection, site not specified: Secondary | ICD-10-CM

## 2016-06-13 LAB — URINALYSIS, ROUTINE W REFLEX MICROSCOPIC
Bilirubin Urine: NEGATIVE
Glucose, UA: NEGATIVE
Ketones, ur: NEGATIVE
Nitrite: NEGATIVE
Protein, ur: NEGATIVE
Specific Gravity, Urine: 1.01 (ref 1.001–1.035)
pH: 7 (ref 5.0–8.0)

## 2016-06-13 LAB — URINALYSIS, MICROSCOPIC ONLY
Bacteria, UA: NONE SEEN [HPF]
Casts: NONE SEEN [LPF]
Crystals: NONE SEEN [HPF]
Yeast: NONE SEEN [HPF]

## 2016-06-13 MED ORDER — ALBUTEROL SULFATE HFA 108 (90 BASE) MCG/ACT IN AERS: INHALATION_SPRAY | RESPIRATORY_TRACT | 11 refills | Status: DC

## 2016-06-13 MED ORDER — NITROFURANTOIN MONOHYD MACRO 100 MG PO CAPS: 100.0000 mg | ORAL_CAPSULE | Freq: Two times a day (BID) | ORAL | 0 refills | Status: DC

## 2016-06-13 MED ORDER — FEXOFENADINE-PSEUDOEPHED ER 180-240 MG PO TB24: 1.0000 | ORAL_TABLET | Freq: Every day | ORAL | 2 refills | Status: DC

## 2016-06-13 NOTE — Patient Instructions (Signed)
Take antibiotics F/U as previous

## 2016-06-13 NOTE — Progress Notes (Signed)
   Subjective:    Patient ID: Stephanie Wall, female    DOB: 12-29-1955, 61 y.o.   MRN: [id number]  Patient presents for Dysuria (x2 days- lower abd pain/ pressure- urinary urgency- states that she has felt really bloated and gassy lately- last BM 2/18)   Over weekend had urinary frequency and urgecy, lower back pain for past 2 days. Has also felt bloated and swollen in abdomen and legs. Bowels are moving regulary regulary, no blood in stool    Mother law passed away 3 weeks ago.  Admits to increased fast food during this time   Review Of Systems:  GEN- denies fatigue, fever, weight loss,weakness, recent illness HEENT- denies eye drainage, change in vision, nasal discharge, CVS- denies chest pain, palpitations RESP- denies SOB, cough, wheeze ABD- denies N/V, change in stools, abd pain GU- + dysuria, denies hematuria, dribbling, incontinence MSK- denies joint pain, muscle aches, injury Neuro- denies headache, dizziness, syncope, seizure activity       Objective:    BP 136/72   Pulse 70   Temp 98.7 F (37.1 C) (Oral)   Resp 12   Ht 5\' 7"  (1.702 m)   Wt 234 lb (106.1 kg)   SpO2 98%   BMI 36.65 kg/m  GEN- NAD, alert and oriented x3 CVS- RRR, no murmur RESP-CTAB ABD-NABS,soft,TTP suprapubic region, ND, no CVA tenderness  EXT- No edema Pulses- Radial  2+        Assessment & Plan:      Problem List Items Addressed This Visit    None    Visit Diagnoses    Urinary tract infection without hematuria, site unspecified    -  Primary   Treat with Macrobid increase water. No sign of fluid overload or any peripheral edema advised to watch the fast food increase in sodium may have caused some transient fluid retention   Relevant Medications   nitrofurantoin, macrocrystal-monohydrate, (MACROBID) 100 MG capsule   Other Relevant Orders   Urinalysis, Routine w reflex microscopic (Completed)      Note: This dictation was prepared with Dragon dictation along with smaller phrase  technology. Any transcriptional errors that result from this process are unintentional.

## 2016-06-14 ENCOUNTER — Other Ambulatory Visit: Payer: Self-pay | Admitting: Family Medicine

## 2016-06-14 DIAGNOSIS — Z1231 Encounter for screening mammogram for malignant neoplasm of breast: Secondary | ICD-10-CM

## 2016-06-24 ENCOUNTER — Encounter: Payer: Self-pay | Admitting: Family Medicine

## 2016-06-24 ENCOUNTER — Ambulatory Visit (INDEPENDENT_AMBULATORY_CARE_PROVIDER_SITE_OTHER): Payer: BLUE CROSS/BLUE SHIELD | Admitting: Family Medicine

## 2016-06-24 VITALS — BP 130/74 | HR 80 | Temp 98.7°F | Resp 16 | Ht 67.0 in | Wt 234.0 lb

## 2016-06-24 DIAGNOSIS — E559 Vitamin D deficiency, unspecified: Secondary | ICD-10-CM

## 2016-06-24 DIAGNOSIS — Z0000 Encounter for general adult medical examination without abnormal findings: Secondary | ICD-10-CM

## 2016-06-24 DIAGNOSIS — Z1211 Encounter for screening for malignant neoplasm of colon: Secondary | ICD-10-CM

## 2016-06-24 DIAGNOSIS — Z1159 Encounter for screening for other viral diseases: Secondary | ICD-10-CM | POA: Diagnosis not present

## 2016-06-24 DIAGNOSIS — H9313 Tinnitus, bilateral: Secondary | ICD-10-CM

## 2016-06-24 DIAGNOSIS — H6122 Impacted cerumen, left ear: Secondary | ICD-10-CM | POA: Diagnosis not present

## 2016-06-24 DIAGNOSIS — E669 Obesity, unspecified: Secondary | ICD-10-CM | POA: Diagnosis not present

## 2016-06-24 DIAGNOSIS — J4520 Mild intermittent asthma, uncomplicated: Secondary | ICD-10-CM

## 2016-06-24 LAB — LIPID PANEL
Cholesterol: 170 mg/dL (ref <200)
HDL: 64 mg/dL (ref >50)
LDL Cholesterol: 93 mg/dL (ref <100)
Total CHOL/HDL Ratio: 2.7 Ratio (ref <5.0)
Triglycerides: 66 mg/dL (ref <150)
VLDL: 13 mg/dL (ref <30)

## 2016-06-24 LAB — CBC WITH DIFFERENTIAL/PLATELET
Basophils Absolute: 44 cells/uL (ref 0–200)
Basophils Relative: 1 %
Eosinophils Absolute: 88 cells/uL (ref 15–500)
Eosinophils Relative: 2 %
HCT: 39.2 % (ref 35.0–45.0)
Hemoglobin: 12.5 g/dL (ref 12.0–15.0)
Lymphocytes Relative: 36 %
Lymphs Abs: 1584 cells/uL (ref 850–3900)
MCH: 29 pg (ref 27.0–33.0)
MCHC: 31.9 g/dL — ABNORMAL LOW (ref 32.0–36.0)
MCV: 91 fL (ref 80.0–100.0)
MPV: 10.2 fL (ref 7.5–12.5)
Monocytes Absolute: 308 cells/uL (ref 200–950)
Monocytes Relative: 7 %
Neutro Abs: 2376 cells/uL (ref 1500–7800)
Neutrophils Relative %: 54 %
Platelets: 282 10*3/uL (ref 140–400)
RBC: 4.31 MIL/uL (ref 3.80–5.10)
RDW: 15 % (ref 11.0–15.0)
WBC: 4.4 10*3/uL (ref 3.8–10.8)

## 2016-06-24 LAB — COMPREHENSIVE METABOLIC PANEL
ALT: 16 U/L (ref 6–29)
AST: 19 U/L (ref 10–35)
Albumin: 4 g/dL (ref 3.6–5.1)
Alkaline Phosphatase: 65 U/L (ref 33–130)
BUN: 9 mg/dL (ref 7–25)
CO2: 24 mmol/L (ref 20–31)
Calcium: 9.5 mg/dL (ref 8.6–10.4)
Chloride: 107 mmol/L (ref 98–110)
Creat: 0.91 mg/dL (ref 0.50–0.99)
Glucose, Bld: 88 mg/dL (ref 70–99)
Potassium: 4.4 mmol/L (ref 3.5–5.3)
Sodium: 142 mmol/L (ref 135–146)
Total Bilirubin: 0.4 mg/dL (ref 0.2–1.2)
Total Protein: 7.1 g/dL (ref 6.1–8.1)

## 2016-06-24 MED ORDER — PHENTERMINE HCL 37.5 MG PO TABS: 37.5000 mg | ORAL_TABLET | Freq: Every day | ORAL | 1 refills | Status: DC

## 2016-06-24 NOTE — Assessment & Plan Note (Signed)
CPE done, immunizations UTD declines flu/shingles Needs colonoscopy in Oct  Fasting labs today   Tinnitus, cerumen removed, history of long term tinnitus, worse with congestion, using Allegra D

## 2016-06-24 NOTE — Progress Notes (Signed)
   Subjective:    Patient ID: Stephanie Wall, female    DOB: 1956/03/27, 61 y.o.   MRN: [id number]  Patient presents for CPE (is fasting)  Pt here for CPE   Immunizations- UTD , declines shingles  Colonoscopy- UTD  Due in Oct 2018  Discussed Hep C screening PAP Smear UTD, Mammogram - Last March 2017  Gets ringing ears, both sides, at times louder, this week had some congestion, using humidifier with diffuser Using Allegra D    No recent issues with asthma, using qvar   Review Of Systems:  GEN- denies fatigue, fever, weight loss,weakness, recent illness HEENT- denies eye drainage, change in vision, nasal discharge, CVS- denies chest pain, palpitations RESP- denies SOB, cough, wheeze ABD- denies N/V, change in stools, abd pain GU- denies dysuria, hematuria, dribbling, incontinence MSK- denies joint pain, muscle aches, injury Neuro- denies headache, dizziness, syncope, seizure activity       Objective:    BP 130/74   Pulse 80   Temp 98.7 F (37.1 C) (Oral)   Resp 16   Ht 5\' 7"  (1.702 m)   Wt 234 lb (106.1 kg)   SpO2 98%   BMI 36.65 kg/m  GEN- NAD, alert and oriented x3 HEENT- PERRL, EOMI, non injected sclera, pink conjunctiva, MMM, oropharynx clear, hearing grossly in tact, TM clear canal clear right side, left wax impaction  Neck- Supple, no thyromegaly CVS- RRR, no murmur RESP-CTAB ABD-NABS,soft,NT,ND EXT- No edema Pulses- Radial, DP- 2+        Assessment & Plan:      Problem List Items Addressed This Visit    Vitamin D deficiency    Screen level on daily mainatance therapy      Relevant Orders   Vitamin D, 25-hydroxy   Routine general medical examination at a health care facility - Primary    CPE done, immunizations UTD declines flu/shingles Needs colonoscopy in Oct  Fasting labs today   Tinnitus, cerumen removed, history of long term tinnitus, worse with congestion, using Allegra D       Relevant Orders   CBC with Differential/Platelet   Comprehensive metabolic panel   Lipid panel   Obesity (BMI 30-39.9)    Discussed weight loss, she has been dieting, zumba classes weekly with little change Discussed phentermine will start with 1/2 tablet increase to 1 tablet daily F/U 6 weeks for recheck      Relevant Medications   phentermine (ADIPEX-P) 37.5 MG tablet   Other Relevant Orders   Lipid panel   Allergy-induced asthma    contineu qvar, ventolin prn        Other Visit Diagnoses    Need for hepatitis C screening test       Relevant Orders   Hepatitis C antibody   Tinnitus of both ears       Impacted cerumen of left ear       Colon cancer screening       Relevant Orders   Ambulatory referral to Gastroenterology      Note: This dictation was prepared with Dragon dictation along with smaller phrase technology. Any transcriptional errors that result from this process are unintentional.

## 2016-06-24 NOTE — Assessment & Plan Note (Signed)
Discussed weight loss, she has been dieting, zumba classes weekly with little change Discussed phentermine will start with 1/2 tablet increase to 1 tablet daily F/U 6 weeks for recheck

## 2016-06-24 NOTE — Assessment & Plan Note (Signed)
Screen level on daily mainatance therapy

## 2016-06-24 NOTE — Patient Instructions (Addendum)
Referral for Colonoscopy - Labuer  F/U 6 weeks for Physical I recommend eye visit once a year I recommend dental visit every 6 months Goal is to  Exercise 30 minutes 5 days a week We will send a letter with lab results

## 2016-06-24 NOTE — Assessment & Plan Note (Signed)
contineu qvar, ventolin prn

## 2016-06-25 LAB — HEPATITIS C ANTIBODY: HCV Ab: NEGATIVE

## 2016-06-25 LAB — VITAMIN D 25 HYDROXY (VIT D DEFICIENCY, FRACTURES): Vit D, 25-Hydroxy: 26 ng/mL — ABNORMAL LOW (ref 30–100)

## 2016-07-08 ENCOUNTER — Ambulatory Visit (INDEPENDENT_AMBULATORY_CARE_PROVIDER_SITE_OTHER): Payer: BLUE CROSS/BLUE SHIELD | Admitting: Family Medicine

## 2016-07-08 ENCOUNTER — Encounter: Payer: Self-pay | Admitting: Family Medicine

## 2016-07-08 VITALS — BP 110/74 | HR 80 | Temp 98.3°F | Resp 18 | Ht 67.0 in | Wt 230.0 lb

## 2016-07-08 DIAGNOSIS — J4521 Mild intermittent asthma with (acute) exacerbation: Secondary | ICD-10-CM | POA: Diagnosis not present

## 2016-07-08 MED ORDER — PREDNISONE 20 MG PO TABS: ORAL_TABLET | ORAL | 0 refills | Status: DC

## 2016-07-08 NOTE — Progress Notes (Signed)
Subjective:    Patient ID: Stephanie Wall, female    DOB: 12/11/1955, 61 y.o.   MRN: [id number]  HPI Patient has a history of allergy-induced asthma. Her husband has been cleaning out his dead mother's estate. There has been a lot of mildew and mold where they have been working. Starting yesterday she developed rhinorrhea, chest congestion, wheezing and coughing. Last night she was having a difficult time breathing due to the wheezing. She took an albuterol neb. This morning, her breathing is better. She has not had a nebulizer treatment and almost 8 hours. Her lungs are clear. However she continues her for some mild shortness of breath with activity. Her biggest concern is headed into the weekend in case the asthma worsens. She denies any fevers or chills. She denies any productive cough. She denies any hemoptysis Past Medical History:  Diagnosis Date  . Abnormal glandular Papanicolaou smear of cervix   . ALLERGIC RHINITIS   . Allergy   . Asthma    allergy induced  . Back pain   . Cervicalgia   . Fibroids    uterus  . Obesity, unspecified   . Snores   . Wears glasses    Past Surgical History:  Procedure Laterality Date  . BREAST CYST EXCISION  1984 and 1997  . BREAST REDUCTION SURGERY Bilateral 07/16/2012   Procedure: BILATERAL BREAST REDUCTION  (BREAST);  Surgeon: Charlene Brooke, MD;  Location: [facility];  Service: Plastics;  Laterality: Bilateral;  . CHOLECYSTECTOMY  1976  . COLONOSCOPY     Current Outpatient Prescriptions on File Prior to Visit  Medication Sig Dispense Refill  . albuterol (PROVENTIL) (2.5 MG/3ML) 0.083% nebulizer solution Take 3 mLs (2.5 mg total) by nebulization every 4 (four) hours as needed for wheezing or shortness of breath. 75 mL 12  . albuterol (VENTOLIN HFA) 108 (90 Base) MCG/ACT inhaler INHALE 2 PUFFS BY MOUTH EVERY 4 HOURS AS NEEDED FOR WHEEZING OR SHORTNESS OF BREATH. 18 g 11  . beclomethasone (QVAR) 80 MCG/ACT inhaler INHALE 2  PUFFS BY MOUTH TWICE DAILY. RINSE MOUTH 8.7 g 5  . CALCIUM PO Take 1,200 mg by mouth daily.    . cholecalciferol (VITAMIN D) 1000 units tablet Take 1,000 Units by mouth daily.    . fexofenadine (ALLEGRA) 30 MG tablet Take 30 mg by mouth 2 (two) times daily.    . phentermine (ADIPEX-P) 37.5 MG tablet Take 1 tablet (37.5 mg total) by mouth daily before breakfast. 30 tablet 1   No current facility-administered medications on file prior to visit.    Allergies  Allergen Reactions  . Demerol Hives  . Dilaudid [Hydromorphone Hcl] Swelling    Could not swallow  . Morphine And Related Hives   Social History   Social History  . Marital status: Married    Spouse name: N/A  . Number of children: N/A  . Years of education: N/A   Occupational History  . 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 Smoking status: Never Smoker  . Smokeless tobacco: Never Used  . Alcohol use No  . Drug use: No  . Sexual activity: Yes   Other Topics Concern  . Not on file   Social History Narrative   Married, living w/ husband Dynastee Brummell, grown children. Housewife,        Review of Systems  All other systems reviewed and are negative.  Objective:   Physical Exam  Constitutional: She appears well-developed and well-nourished.  HENT:  Right Ear: External ear normal.  Left Ear: External ear normal.  Nose: Nose normal.  Mouth/Throat: Oropharynx is clear and moist. No oropharyngeal exudate.  Neck: Neck supple. No thyromegaly present.  Cardiovascular: Normal rate, regular rhythm and normal heart sounds.   Pulmonary/Chest: Effort normal and breath sounds normal. No respiratory distress. She has no wheezes. She has no rales.  Abdominal: Soft. Bowel sounds are normal.  Lymphadenopathy:    She has no cervical adenopathy.  Vitals reviewed.         Assessment & Plan:  Mild intermittent asthma with exacerbation - Plan: predniSONE (DELTASONE)  20 MG tablet  At the present time, her asthma seems well controlled. I recommended that she use either her nebulizer albuterol Ventolin every 6 hours as needed for wheezing. Should the wheezing worsened over the weekend and she required albuterol every 6 hours, I did give her a prescription for prednisone to start to take to treat an asthma exacerbation. She will not fill this prescription unless symptoms worsen. At the present time it seemed like her last albuterol nebulizer stopped/slowed significantly the exacerbation

## 2016-07-11 ENCOUNTER — Ambulatory Visit: Payer: BLUE CROSS/BLUE SHIELD

## 2016-07-19 ENCOUNTER — Telehealth: Payer: Self-pay | Admitting: *Deleted

## 2016-07-19 MED ORDER — BUDESONIDE 180 MCG/ACT IN AEPB: 2.0000 | INHALATION_SPRAY | Freq: Two times a day (BID) | RESPIRATORY_TRACT | 12 refills | Status: DC

## 2016-07-19 NOTE — Telephone Encounter (Signed)
Received fax requesting medication change on QVar inhaler.   Inhaler has been D/C'ed from manufacturer.   MD please advise.

## 2016-07-19 NOTE — Telephone Encounter (Signed)
Call placed to patient and patient made aware.   Prescription sent to pharmacy.  

## 2016-07-19 NOTE — Telephone Encounter (Signed)
She has been on Pulmicort send 180mg  flexhaler 2 puffs BID

## 2016-07-20 ENCOUNTER — Ambulatory Visit
Admission: RE | Admit: 2016-07-20 | Discharge: 2016-07-20 | Disposition: A | Discharge status: Home / Self Care | Payer: BLUE CROSS/BLUE SHIELD | Source: Ambulatory Visit | Attending: Family Medicine | Admitting: Family Medicine

## 2016-07-20 DIAGNOSIS — Z1231 Encounter for screening mammogram for malignant neoplasm of breast: Secondary | ICD-10-CM

## 2016-07-26 ENCOUNTER — Encounter: Payer: Self-pay | Admitting: Family Medicine

## 2016-07-26 ENCOUNTER — Ambulatory Visit: Payer: BLUE CROSS/BLUE SHIELD

## 2016-08-08 ENCOUNTER — Ambulatory Visit (INDEPENDENT_AMBULATORY_CARE_PROVIDER_SITE_OTHER): Payer: BLUE CROSS/BLUE SHIELD | Admitting: Family Medicine

## 2016-08-08 ENCOUNTER — Encounter: Payer: Self-pay | Admitting: Family Medicine

## 2016-08-08 ENCOUNTER — Encounter (INDEPENDENT_AMBULATORY_CARE_PROVIDER_SITE_OTHER): Payer: BLUE CROSS/BLUE SHIELD | Admitting: Family Medicine

## 2016-08-08 VITALS — BP 128/72 | HR 80 | Temp 98.0°F | Resp 14 | Ht 67.0 in | Wt 231.0 lb

## 2016-08-08 DIAGNOSIS — J301 Allergic rhinitis due to pollen: Secondary | ICD-10-CM | POA: Diagnosis not present

## 2016-08-08 DIAGNOSIS — J4520 Mild intermittent asthma, uncomplicated: Secondary | ICD-10-CM

## 2016-08-08 DIAGNOSIS — E669 Obesity, unspecified: Secondary | ICD-10-CM | POA: Diagnosis not present

## 2016-08-08 NOTE — Assessment & Plan Note (Signed)
He states that she is going to consider trying the phentermine again. If she does go ahead and take the medication she is to follow-up in 2 months for her weight.  She has small blackhead removed from the ear

## 2016-08-08 NOTE — Progress Notes (Signed)
   Subjective:    Patient ID: Stephanie Wall, female    DOB: [birth year], 61 y.o.   MRN: [id number]  Patient presents for Weight Check and L Ear Bump  Patient to follow-up weight however she only took the phentermine for a week and she then became sick with asthma exacerbation. She would like to start medication again now that she is feeling better. She also has a spot on her left ear is not painful but she would like me to check it. She is not sure how long it has been there.  Asthma and allergy she has multiple allergen induced asthma exacerbations. She is currently on Qvar however we did try to switch her to Pulmicort as this is no longer available should not like the powdery taste. She is not one of being on any inhalers and states in the past someone gave her a shot that lasted all year long I'm not sure if this was an allergy shot or what this was. She would like to return to the allergy and asthma specialist to see if there is something else she can do besides being on medications. She has been on Singulair in the past but does not want to go back on this. She does use Allegra. No current issues     Review Of Systems:  GEN- denies fatigue, fever, weight loss,weakness, recent illness HEENT- denies eye drainage, change in vision, nasal discharge, CVS- denies chest pain, palpitations RESP- denies SOB, cough, wheeze ABD- denies N/V, change in stools, abd pain GU- denies dysuria, hematuria, dribbling, incontinence MSK- denies joint pain, muscle aches, injury Neuro- denies headache, dizziness, syncope, seizure activity       Objective:    BP 128/72   Pulse 80   Temp 98 F (36.7 C) (Oral)   Resp 14   Ht 5\' 7"  (1.702 m)   Wt 231 lb (104.8 kg)   SpO2 97%   BMI 36.18 kg/m  GEN- NAD, alert and oriented x3 HEENT- PERRL, EOMI, non injected sclera, pink conjunctiva, MMM, oropharynx clear, Left ear canal clear, small black head expressed with currette at bedside (verbal consent given)  topical antibiotic placed Neck- Supple,no LAD CVS- RRR, no murmur RESP-CTAB Pulses- Radial 2+        Assessment & Plan:    Note her initial note was deleted accidentally during check out process      Problem List Items Addressed This Visit    Obesity (BMI 30-39.9)    He states that she is going to consider trying the phentermine again. If she does go ahead and take the medication she is to follow-up in 2 months for her weight.  She has small blackhead removed from the ear      Allergy-induced asthma - Primary    She declines further medications. We'll send her to allergy and asthma specialist sounds like she wants alelrgy shots.      Allergic rhinitis      Note: This dictation was prepared with Dragon dictation along with smaller phrase technology. Any transcriptional errors that result from this process are unintentional.

## 2016-08-08 NOTE — Patient Instructions (Addendum)
Referral to asthma/allergy specialist Call if you stay on the weight loss medication

## 2016-08-08 NOTE — Patient Instructions (Signed)
Referral to allergist

## 2016-08-08 NOTE — Assessment & Plan Note (Signed)
She declines further medications. We'll send her to allergy and asthma specialist sounds like she wants alelrgy shots.

## 2016-08-08 NOTE — Progress Notes (Signed)
   Subjective:    Patient ID: Stephanie Wall, female    DOB: 1955/12/25, 61 y.o.   MRN: [id number]  Patient presents for 6 Week F/U (Weight) and L Ear Irritation (has small bump like area to inner ear)     Review Of Systems:  GEN- denies fatigue, fever, weight loss,weakness, recent illness HEENT- denies eye drainage, change in vision, nasal discharge, CVS- denies chest pain, palpitations RESP- denies SOB, cough, wheeze ABD- denies N/V, change in stools, abd pain GU- denies dysuria, hematuria, dribbling, incontinence MSK- denies joint pain, muscle aches, injury Neuro- denies headache, dizziness, syncope, seizure activity       Objective:    BP 128/72   Pulse 80   Temp 98 F (36.7 C) (Oral)   Resp 14   Ht 5\' 7"  (1.702 m)   Wt 231 lb (104.8 kg)   SpO2 97%   BMI 36.18 kg/m  GEN- NAD, alert and oriented x3 HEENT- PERRL, EOMI, non injected sclera, pink conjunctiva, MMM, oropharynx clear Neck- Supple, no thyromegaly CVS- RRR, no murmur RESP-CTAB ABD-NABS,soft,NT,ND EXT- No edema Pulses- Radial, DP- 2+        Assessment & Plan:      Problem List Items Addressed This Visit    None      Note: This dictation was prepared with Dragon dictation along with smaller phrase technology. Any transcriptional errors that result from this process are unintentional.

## 2016-09-13 ENCOUNTER — Encounter: Payer: Self-pay | Admitting: Family Medicine

## 2016-09-30 ENCOUNTER — Ambulatory Visit: Payer: BLUE CROSS/BLUE SHIELD | Admitting: Allergy & Immunology

## 2016-11-16 ENCOUNTER — Encounter: Payer: Self-pay | Admitting: Allergy & Immunology

## 2016-11-16 ENCOUNTER — Ambulatory Visit (INDEPENDENT_AMBULATORY_CARE_PROVIDER_SITE_OTHER): Payer: BLUE CROSS/BLUE SHIELD | Admitting: Allergy & Immunology

## 2016-11-16 VITALS — BP 118/74 | HR 65 | Resp 19 | Ht 66.0 in | Wt 228.8 lb

## 2016-11-16 DIAGNOSIS — T781XXD Other adverse food reactions, not elsewhere classified, subsequent encounter: Secondary | ICD-10-CM

## 2016-11-16 DIAGNOSIS — J3089 Other allergic rhinitis: Secondary | ICD-10-CM | POA: Diagnosis not present

## 2016-11-16 DIAGNOSIS — J302 Other seasonal allergic rhinitis: Secondary | ICD-10-CM | POA: Insufficient documentation

## 2016-11-16 DIAGNOSIS — J4520 Mild intermittent asthma, uncomplicated: Secondary | ICD-10-CM | POA: Insufficient documentation

## 2016-11-16 MED ORDER — ALBUTEROL SULFATE HFA 108 (90 BASE) MCG/ACT IN AERS: 4.0000 | INHALATION_SPRAY | RESPIRATORY_TRACT | 2 refills | Status: DC | PRN

## 2016-11-16 MED ORDER — FLUTICASONE PROPIONATE 50 MCG/ACT NA SUSP: 2.0000 | Freq: Every day | NASAL | 3 refills | Status: DC

## 2016-11-16 MED ORDER — EPINEPHRINE 0.3 MG/0.3ML IJ SOAJ: 0.3000 mg | Freq: Once | INTRAMUSCULAR | 2 refills | Status: AC

## 2016-11-16 MED ORDER — MONTELUKAST SODIUM 10 MG PO TABS: 10.0000 mg | ORAL_TABLET | Freq: Every day | ORAL | 3 refills | Status: DC

## 2016-11-16 MED ORDER — FLUTICASONE PROPIONATE HFA 110 MCG/ACT IN AERO: 2.0000 | INHALATION_SPRAY | Freq: Two times a day (BID) | RESPIRATORY_TRACT | 3 refills | Status: DC

## 2016-11-16 NOTE — Progress Notes (Signed)
NEW PATIENT  Date of Service/Encounter:  11/16/16  Referring provider: Alycia Rossetti, MD   Assessment:   Mild intermittent asthma, uncomplicated  Seasonal and perennial allergic rhinitis (grasses, weeds, ragweed, trees, molds, dog, cat, and dust mite)  Adverse food reaction (peanut, walnut) - with equivocal testing to peanut   Asthma Reportables:  Severity: intermittent  Risk: low Control: well controlled   Plan/Recommendations:    Patient Instructions  1. Intermittent asthma, uncomplicated - Lung testing looked normal today. - We will change your Qvar to Flovent. - Symptoms overall are well controlled with a PRN use of an ICS and SABA. - I am unsure that a few puffs of the ICS intermittently is doing very much, but it seems to keep her from having more severe attacks.  - Daily controller medication(s): Singulair 10mg  daily - Rescue medications: ProAir 4 puffs every 4-6 hours as needed - Changes during respiratory infections or worsening symptoms: start Flovent 132mcg 2 puffs twice daily for ONE TO TWO WEEKS. - Asthma control goals:  * Full participation in all desired activities (may need albuterol before activity) * Albuterol use two time or less a week on average (not counting use with activity) * Cough interfering with sleep two time or less a month * Oral steroids no more than once a year * No hospitalizations  2. Chronic allergic rhinitis - Testing today showed: trees, weeds, grasses, molds, dust mites, cat and dog - Avoidance measures provided. - Continue with Allegra (fexofenadine) 180mg  table once daily - Start Singulair (montelukast) 10mg  daily and Flonase (fluticasone) two sprays per nostril daily - You can use an extra dose of the antihistamine, if needed, for breakthrough symptoms.  - Consider nasal saline rinses 1-2 times daily to remove allergens from the nasal cavities as well as help with mucous clearance (this is especially helpful to do before  the nasal sprays are given) - Consider allergy shots as a means of long-term control. - Allergy shots "re-train" the immune system to ignore environmental allergens and decrease the resulting immune response to those allergens (sneezing, itchy watery eyes, runny nose, nasal congestion, etc).   - Risks and benefits of allergen immunotherapy discussed.  - Stephanie Wall is very motivated to pursue allergen immunotherapy given her year round, worsening symptoms.   3. Adverse food reaction (peanuts, walnuts) - Testing showed: negative to peanuts and tree nuts. - We will not send blood work since Stephanie Wall will continue to avoid it anyway. Stephanie Wall prescription sent in.   4. Return in about 3 months (around 02/16/2017).    Subjective:   Stephanie Wall is a 61 y.o. female presenting today for evaluation of  Chief Complaint  Patient presents with  . Allergies    Interested in shots. Taking Allegra.   . Asthma    Stephanie Wall has a history of the following: Patient Active Problem List   Diagnosis Date Noted  . Mild intermittent asthma, uncomplicated 40/98/1191  . Seasonal and perennial allergic rhinitis 11/16/2016  . Vitamin D deficiency 06/10/2015  . Constipation, chronic 05/26/2015  . Allergy-induced asthma 05/30/2014  . Routine general medical examination at a health care facility 07/01/2013  . Obesity (BMI 30-39.9) 07/01/2013  . Allergic rhinitis 01/25/2009  . FIBROIDS, UTERUS 01/21/2009  . PAP SMEAR, ABNORMAL 01/21/2009    History obtained from: chart review and patient.  Stephanie Wall was referred by Stephanie Rossetti, MD.     Stephanie Wall is a 61 y.o. female presenting for an allergy and  asthma evaluation.   Asthma/Respiratory Symptom History: She has asthma that was diagnosed in 2000. She is not taking anything routinely, but she has Qvar which she starts when she has respiratory problems. She also has albuterol. She would get an annual injection (sounds like a steroid  injection) since she was teenager. She has seen Dr. Annamaria Boots in the past, who managed her asthma and allergies. The last time that she had prednisone was earlier in 2018. She did have prednisone prescribed in March 2018, but she ended up not using it. She denies night time coughing. She does Zumba now without a problem, participating 2-3 times per week. Triggers include allergens as well as viral URIs.   Allergic Rhinitis Symptom History:  She has had symptoms since she was a teenager. They worsened in the early 2000s. At the time, she was having problems with asthma. She was living in [place]. She was getting her care through a free clinic at that time. She was allergy tested by Dr. Ishmael Holter in 2005, but we do not have the records available since we are in the process of scanning all of the charts. Currently she is taking Allegra only, which she stopped in anticipation for this visit. Symptoms do resolve in the winter months, although it should be note that she continues to take her Allegra throughout the year. She knows that she allergic to cats, but thinks that she is fine around dogs.   Food Allergy Symptom History: She tolerates all of the major food allergens aside from peanuts and walnuts (tongue swelling). She does tolerate almonds, pecans, and other tree nuts without a problem. Otherwise, there is no history of other atopic diseases, including drug allergies, food allergies, stinging insect allergies, or urticaria. There is no significant infectious history (estimates that she receives antibiotics around one time per year). Vaccinations are up to date.    Past Medical History: Patient Active Problem List   Diagnosis Date Noted  . Mild intermittent asthma, uncomplicated 12/06/4816  . Seasonal and perennial allergic rhinitis 11/16/2016  . Vitamin D deficiency 06/10/2015  . Constipation, chronic 05/26/2015  . Allergy-induced asthma 05/30/2014  . Routine general medical examination at a health care  facility 07/01/2013  . Obesity (BMI 30-39.9) 07/01/2013  . Allergic rhinitis 01/25/2009  . FIBROIDS, UTERUS 01/21/2009  . PAP SMEAR, ABNORMAL 01/21/2009    Medication List:  Allergies as of 11/16/2016      Reactions   Demerol Hives   Dilaudid [hydromorphone Hcl] Swelling   Could not swallow   Morphine And Related Hives      Medication List       Accurate as of 11/16/16 10:29 AM. Always use your most recent med list.          albuterol (2.5 MG/3ML) 0.083% nebulizer solution Commonly known as:  PROVENTIL Take 3 mLs (2.5 mg total) by nebulization every 4 (four) hours as needed for wheezing or shortness of breath.   albuterol 108 (90 Base) MCG/ACT inhaler Commonly known as:  VENTOLIN HFA INHALE 2 PUFFS BY MOUTH EVERY 4 HOURS AS NEEDED FOR WHEEZING OR SHORTNESS OF BREATH.   CALCIUM PO Take 1,200 mg by mouth daily.   cholecalciferol 1000 units tablet Commonly known as:  VITAMIN D Take 1,000 Units by mouth daily.   fexofenadine 30 MG tablet Commonly known as:  ALLEGRA Take 30 mg by mouth 2 (two) times daily.       Birth History: non-contributory.   Developmental History: non-contributory.   Past Surgical History:  Past Surgical History:  Procedure Laterality Date  . BREAST CYST EXCISION  1984 and 1997  . BREAST REDUCTION SURGERY Bilateral 07/16/2012   Procedure: BILATERAL BREAST REDUCTION  (BREAST);  Surgeon: Charlene Brooke, MD;  Location: [facility];  Service: Plastics;  Laterality: Bilateral;  . CHOLECYSTECTOMY  1976  . COLONOSCOPY       Family History: Family History  Problem Relation Age of Onset  . Hypertension Mother   . Birth defects Mother   . Allergic rhinitis Mother   . Hypertension Father   . Arthritis Maternal Grandmother   . Cancer Maternal Grandmother 47       Breast Cancer  . Hypertension Maternal Grandmother   . Heart disease Maternal Grandmother   . Diabetes Maternal Grandmother   . Thyroid disease Maternal  Grandmother   . Heart disease Maternal Grandfather   . Arthritis Paternal Grandmother   . Kidney disease Paternal Grandmother   . Hypertension Paternal Grandmother   . Stroke Paternal Grandfather   . HIV Sister   . Heart disease Paternal Aunt        CHF  . Asthma Neg Hx   . Angioedema Neg Hx   . Atopy Neg Hx   . Eczema Neg Hx   . Immunodeficiency Neg Hx   . Urticaria Neg Hx      Social History: Stephanie Wall lives at home with her husband. She has six children and 17 grand children in [place], [place], and here in [place]. She completed a degree in Criminal Justice from [employer] but unfortunately she was unable to get a job after graduating. 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 She lives in a house that was 63 years old. There is carpeting throughout the home. There is no mildew or roaches in the home. She does have dust mite covers on her lows, but not her bed.    Review of Systems: a 14-point review of systems is pertinent for what is mentioned in HPI.  Otherwise, all other systems were negative. Constitutional: negative other than that listed in the HPI Eyes: negative other than that listed in the HPI Ears, nose, mouth, throat, and face: negative other than that listed in the HPI Respiratory: negative other than that listed in the HPI Cardiovascular: negative other than that listed in the HPI Gastrointestinal: negative other than that listed in the HPI Genitourinary: negative other than that listed in the HPI Integument: negative other than that listed in the HPI Hematologic: negative other than that listed in the HPI Musculoskeletal: negative other than that listed in the HPI Neurological: negative other than that listed in the HPI Allergy/Immunologic: negative other than that listed in the HPI    Objective:   Blood pressure 118/74, pulse 65, resp. rate 19, height 5\' 6"  (1.676 m), weight 228 lb 12.8 oz (103.8 kg), SpO2 98 %. Body mass index  is 36.93 kg/m.   Physical Exam:  General: Alert, interactive, in no acute distress. Very sweet female. Talkative.  Eyes: No conjunctival injection present on the right, No conjunctival injection present on the left, PERRL bilaterally, No discharge on the right and No discharge on the left Ears: Right TM pearly gray with normal light reflex, Left TM pearly gray with normal light reflex, Right TM intact without perforation and Left TM intact without perforation.  Nose/Throat: External nose within normal limits and septum midline, turbinates edematous and pale with clear discharge, post-pharynx erythematous with cobblestoning in the posterior oropharynx.  Tonsils 2+ without exudates Neck: Supple without thyromegaly.  Adenopathy: Shoddy bilateral anterior cervical lymphadenopathy. and No enlarged lymph nodes appreciated in the occipital, axillary, epitrochlear, inguinal, or popliteal regions. Lungs: Clear to auscultation without wheezing, rhonchi or rales. No increased work of breathing. CV: Normal S1/S2, no murmurs. Capillary refill <2 seconds.  Abdomen: Nondistended, nontender. No guarding or rebound tenderness. Bowel sounds present in all fields and hypoactive  Skin: Warm and dry, without lesions or rashes. Extremities:  No clubbing, cyanosis or edema. Neuro:   Grossly intact. No focal deficits appreciated. Responsive to questions.  Diagnostic studies:   Spirometry: results normal (FEV1: 2.22/104%, FVC: 2.78/103%, FEV1/FVC: 79%).    Spirometry consistent with normal pattern.  Allergy Studies:   Indoor/Outdoor Percutaneous Adult Environmental Panel: positive to bahia grass, [place] grass, johnson grass, [place] blue grass, meadow fescue grass, perennial rye grass, sweet vernal grass, timothy grass, burweed marsh elder, short ragweed, giant ragweed, English plantain, sheep sorrel, rough pigweed, rough marsh elder, common mugwort, ash, birch, red cedar, elm, hickory, oak, pecan pollen, [place]  sycamore and black walnut pollen. Otherwise negative with adequate controls.  Indoor/Outdoor Selected Intradermal Environmental Panel: positive to mold mix #2, mold mix #3, mold mix #4, cat, dog and mite mix. Otherwise negative with adequate controls.  Selected Foods Panel: equivocal to peanut, but negative to all of the tree nuts tested (cashew, pecan, walnut, almond, [place] nut, hazelnut) with adequate controls.      Salvatore Marvel, MD [facility]

## 2016-11-16 NOTE — Patient Instructions (Addendum)
1. Intermittent asthma, uncomplicated - Lung testing looked normal today. - We will change your Qvar to Flovent. - Daily controller medication(s): NONE - Rescue medications: ProAir 4 puffs every 4-6 hours as needed - Changes during respiratory infections or worsening symptoms: start Flovent 167mcg 2 puffs twice daily for ONE TO TWO WEEKS. - Asthma control goals:  * Full participation in all desired activities (may need albuterol before activity) * Albuterol use two time or less a week on average (not counting use with activity) * Cough interfering with sleep two time or less a month * Oral steroids no more than once a year * No hospitalizations  2. Chronic allergic rhinitis - Testing today showed: trees, weeds, grasses, molds, dust mites, cat and dog - Avoidance measures provided. - Continue with Allegra (fexofenadine) 180mg  table once daily - Start Singulair (montelukast) 10mg  daily and Flonase (fluticasone) two sprays per nostril daily - You can use an extra dose of the antihistamine, if needed, for breakthrough symptoms.  - Consider nasal saline rinses 1-2 times daily to remove allergens from the nasal cavities as well as help with mucous clearance (this is especially helpful to do before the nasal sprays are given) - Consider allergy shots as a means of long-term control. - Allergy shots "re-train" the immune system to ignore environmental allergens and decrease the resulting immune response to those allergens (sneezing, itchy watery eyes, runny nose, nasal congestion, etc).   - We can discuss more at the next appointment if the medications are not working for you.  3. Adverse food reaction (peanuts, walnuts) - Testing showed: negative to peanuts and tree nuts. - We will not send blood work since you are planning to avoid it anyway. [name] prescription sent in.   4. Return in about 3 months (around 02/16/2017).   Please inform us of any Emergency Department visits,  hospitalizations, or changes in symptoms. Call us before going to the ED for breathing or allergy symptoms since we might be able to fit you in for a sick visit. Feel free to contact us anytime with any questions, problems, or concerns.  It was a pleasure to meet you today! Happy summer!   Reducing Pollen Exposure  The American Academy of Allergy, Asthma and Immunology suggests the following steps to reduce your exposure to pollen during allergy seasons.    1. Do not hang sheets or clothing out to dry; pollen may collect on these items. 2. Do not mow lawns or spend time around freshly cut grass; mowing stirs up pollen. 3. Keep windows closed at night.  Keep car windows closed while driving. 4. Minimize morning activities outdoors, a time when pollen counts are usually at their highest. 5. Stay indoors as much as possible when pollen counts or humidity is high and on windy days when pollen tends to remain in the air longer. 6. Use air conditioning when possible.  Many air conditioners have filters that trap the pollen spores. 7. Use a HEPA room air filter to remove pollen form the indoor air you breathe.  Control of Mold Allergen  Mold and fungi can grow on a variety of surfaces provided certain temperature and moisture conditions exist.  Outdoor molds grow on plants, decaying vegetation and soil.  The major outdoor mold, Alternaria and Cladosporium, are found in very high numbers during hot and dry conditions.  Generally, a late Summer - Fall peak is seen for common outdoor fungal spores.  Rain will temporarily lower outdoor mold spore count, but  counts rise rapidly when the rainy period ends.  The most important indoor molds are Aspergillus and Penicillium.  Dark, humid and poorly ventilated basements are ideal sites for mold growth.  The next most common sites of mold growth are the bathroom and the kitchen.  Outdoor [employer] 1. Use air conditioning and keep windows closed 2. Avoid  exposure to decaying vegetation. 3. Avoid leaf raking. 4. Avoid grain handling. 5. Consider wearing a face mask if working in moldy areas.  Indoor Mold Control 1. Maintain humidity below 50%. 2. Clean washable surfaces with 5% bleach solution. 3. Remove sources e.g. contaminated carpets.  Control of Dog or Cat Allergen  Avoidance is the best way to manage a dog or cat allergy. If you have a dog or cat and are allergic to dog or cats, consider removing the dog or cat from the home. If you have a dog or cat but don't want to find it a new home, or if your family wants a pet even though someone in the household is allergic, here are some strategies that may help keep symptoms at bay:  1. Keep the pet out of your bedroom and restrict it to only a few rooms. Be advised that keeping the dog or cat in only one room will not limit the allergens to that room. 2. Don't pet, hug or kiss the dog or cat; if you do, wash your hands with soap and water. 3. High-efficiency particulate air (HEPA) cleaners run continuously in a bedroom or living room can reduce allergen levels over time. 4. Regular use of a high-efficiency vacuum cleaner or a central vacuum can reduce allergen levels. 5. Giving your dog or cat a bath at least once a week can reduce airborne allergen.  Control of House Dust Mite Allergen    House dust mites play a major role in allergic asthma and rhinitis.  They occur in environments with high humidity wherever human skin, the food for dust mites is found. High levels have been detected in dust obtained from mattresses, pillows, carpets, upholstered furniture, bed covers, clothes and soft toys.  The principal allergen of the house dust mite is found in its feces.  A gram of dust may contain 1,000 mites and 250,000 fecal particles.  Mite antigen is easily measured in the air during house cleaning activities.    1. Encase mattresses, including the box spring, and pillow, in an air tight  cover.  Seal the zipper end of the encased mattresses with wide adhesive tape. 2. Wash the bedding in water of 130 degrees Farenheit weekly.  Avoid cotton comforters/quilts and flannel bedding: the most ideal bed covering is the dacron comforter. 3. Remove all upholstered furniture from the bedroom. 4. Remove carpets, carpet padding, rugs, and non-washable window drapes from the bedroom.  Wash drapes weekly or use plastic window coverings. 5. Remove all non-washable stuffed toys from the bedroom.  Wash stuffed toys weekly. 6. Have the room cleaned frequently with a vacuum cleaner and a damp dust-mop.  The patient should not be in a room which is being cleaned and should wait 1 hour after cleaning before going into the room. 7. Close and seal all heating outlets in the bedroom.  Otherwise, the room will become filled with dust-laden air.  An electric heater can be used to heat the room. 8. Reduce indoor humidity to less than 50%.  Do not use a humidifier.     Websites that have reliable patient information: 1. American  Academy of Asthma, Allergy, and Immunology: www.aaaai.org 2. Food Allergy Research and Education (FARE): foodallergy.org 3. Mothers of Asthmatics: http://www.asthmacommunitynetwork.org 4. American College of Allergy, Asthma, and Immunology: www.acaai.org

## 2016-12-13 ENCOUNTER — Other Ambulatory Visit: Payer: Self-pay | Admitting: Family Medicine

## 2016-12-14 NOTE — Telephone Encounter (Signed)
Needs OV before refill 

## 2016-12-14 NOTE — Telephone Encounter (Signed)
Ok to refill 

## 2016-12-15 NOTE — Telephone Encounter (Signed)
Call placed to patient and patient made aware.   Appointment scheduled.  

## 2016-12-20 ENCOUNTER — Ambulatory Visit: Payer: Self-pay | Admitting: Family Medicine

## 2016-12-28 ENCOUNTER — Encounter: Payer: Self-pay | Admitting: Gastroenterology

## 2017-02-16 ENCOUNTER — Encounter: Payer: Self-pay | Admitting: Gastroenterology

## 2017-02-16 ENCOUNTER — Ambulatory Visit (AMBULATORY_SURGERY_CENTER): Payer: Self-pay

## 2017-02-16 VITALS — Ht 67.0 in | Wt 229.6 lb

## 2017-02-16 DIAGNOSIS — Z1211 Encounter for screening for malignant neoplasm of colon: Secondary | ICD-10-CM

## 2017-02-16 MED ORDER — NA SULFATE-K SULFATE-MG SULF 17.5-3.13-1.6 GM/177ML PO SOLN: 1.0000 | Freq: Once | ORAL | 0 refills | Status: AC

## 2017-02-16 NOTE — Progress Notes (Signed)
Denies allergies to eggs or soy products. Denies complication of anesthesia or sedation. Denies use of weight loss medication. Denies use of O2.   Emmi instructions declined.  

## 2017-03-02 ENCOUNTER — Other Ambulatory Visit: Payer: Self-pay

## 2017-03-02 ENCOUNTER — Encounter: Payer: Self-pay | Admitting: Gastroenterology

## 2017-03-02 ENCOUNTER — Ambulatory Visit (AMBULATORY_SURGERY_CENTER): Payer: BLUE CROSS/BLUE SHIELD | Admitting: Gastroenterology

## 2017-03-02 VITALS — BP 116/64 | HR 53 | Temp 97.5°F | Resp 14 | Ht 67.0 in | Wt 229.0 lb

## 2017-03-02 DIAGNOSIS — Z1211 Encounter for screening for malignant neoplasm of colon: Secondary | ICD-10-CM | POA: Diagnosis not present

## 2017-03-02 DIAGNOSIS — Z1212 Encounter for screening for malignant neoplasm of rectum: Secondary | ICD-10-CM

## 2017-03-02 MED ORDER — SODIUM CHLORIDE 0.9 % IV SOLN: 500.0000 mL | INTRAVENOUS | Status: DC

## 2017-03-02 NOTE — Progress Notes (Signed)
Pt's states no medical or surgical changes since previsit or office visit. 

## 2017-03-02 NOTE — Patient Instructions (Signed)
YOU HAD AN ENDOSCOPIC PROCEDURE TODAY AT THE [facility]:   Refer to the procedure report that was given to you for any specific questions about what was found during the examination.  If the procedure report does not answer your questions, please call your gastroenterologist to clarify.  If you requested that your care partner not be given the details of your procedure findings, then the procedure report has been included in a sealed envelope for you to review at your convenience later.  YOU SHOULD EXPECT: Some feelings of bloating in the abdomen. Passage of more gas than usual.  Walking can help get rid of the air that was put into your GI tract during the procedure and reduce the bloating. If you had a lower endoscopy (such as a colonoscopy or flexible sigmoidoscopy) you may notice spotting of blood in your stool or on the toilet paper. If you underwent a bowel prep for your procedure, you may not have a normal bowel movement for a few days.  Please Note:  You might notice some irritation and congestion in your nose or some drainage.  This is from the oxygen used during your procedure.  There is no need for concern and it should clear up in a day or so.  SYMPTOMS TO REPORT IMMEDIATELY:   Following lower endoscopy (colonoscopy or flexible sigmoidoscopy):  Excessive amounts of blood in the stool  Significant tenderness or worsening of abdominal pains  Swelling of the abdomen that is new, acute  Fever of 100F or higher  For urgent or emergent issues, a gastroenterologist can be reached at any hour by calling [phone].   DIET:  We do recommend a small meal at first, but then you may proceed to your regular diet.  Drink plenty of fluids but you should avoid alcoholic beverages for 24 hours.  MEDICATIONS: Continue present medications.  Please see handouts given to you by your recovery nurse.  ACTIVITY:  You should plan to take it easy for the rest of today and you should NOT  DRIVE or use heavy machinery until tomorrow (because of the sedation medicines used during the test).    FOLLOW UP: Our staff will call the number listed on your records the next business day following your procedure to check on you and address any questions or concerns that you may have regarding the information given to you following your procedure. If we do not reach you, we will leave a message.  However, if you are feeling well and you are not experiencing any problems, there is no need to return our call.  We will assume that you have returned to your regular daily activities without incident.  If any biopsies were taken you will be contacted by phone or by letter within the next 1-3 weeks.  Please call us at [phone] if you have not heard about the biopsies in 3 weeks.   Thank you for allowing us to provide for your healthcare needs today.   SIGNATURES/CONFIDENTIALITY: You and/or your care partner have signed paperwork which will be entered into your electronic medical record.  These signatures attest to the fact that that the information above on your After Visit Summary has been reviewed and is understood.  Full responsibility of the confidentiality of this discharge information lies with you and/or your care-partner. 

## 2017-03-02 NOTE — Op Note (Signed)
[facility] Patient Name: Stephanie Wall Procedure Date: 03/02/2017 11:26 AM MRN: [id number] Endoscopist: Mauri Pole , MD Age: 61 Referring MD:  Date of Birth: 10/08/1955 Gender: Female Account #: [id number] Procedure:                Colonoscopy Indications:              Screening for colorectal malignant neoplasm Medicines:                Monitored Anesthesia Care Procedure:                Pre-Anesthesia Assessment:                           - Prior to the procedure, a History and Physical                            was performed, and patient medications and                            allergies were reviewed. The patient's tolerance of                            previous anesthesia was also reviewed. The risks                            and benefits of the procedure and the sedation                            options and risks were discussed with the patient.                            All questions were answered, and informed consent                            was obtained. Prior Anticoagulants: The patient has                            taken no previous anticoagulant or antiplatelet                            agents. ASA Grade Assessment: II - A patient with                            mild systemic disease. After reviewing the risks                            and benefits, the patient was deemed in                            satisfactory condition to undergo the procedure.                           After obtaining informed consent, the colonoscope  was passed under direct vision. Throughout the                            procedure, the patient's blood pressure, pulse, and                            oxygen saturations were monitored continuously. The                            Model PCF-[id number]) scope was introduced                            through the anus and advanced to the the cecum,                             identified by appendiceal orifice and ileocecal                            valve. The colonoscopy was performed without                            difficulty. The patient tolerated the procedure                            well. The quality of the bowel preparation was                            excellent. The ileocecal valve, appendiceal                            orifice, and rectum were photographed. Scope In: 11:33:00 AM Scope Out: 11:43:44 AM Scope Withdrawal Time: 0 hours 7 minutes 37 seconds  Total Procedure Duration: 0 hours 10 minutes 44 seconds  Findings:                 The perianal and digital rectal examinations were                            normal.                           Multiple small and large-mouthed diverticula were                            found in the sigmoid colon and descending colon.                           Non-bleeding internal hemorrhoids were found during                            retroflexion. The hemorrhoids were small.                           The exam was otherwise without abnormality. Complications:            No immediate complications.  Estimated Blood Loss:     Estimated blood loss was minimal. Estimated blood                            loss: none. Impression:               - Diverticulosis in the sigmoid colon and in the                            descending colon.                           - Non-bleeding internal hemorrhoids.                           - The examination was otherwise normal.                           - No specimens collected. Recommendation:           - Patient has a contact number available for                            emergencies. The signs and symptoms of potential                            delayed complications were discussed with the                            patient. Return to normal activities tomorrow.                            Written discharge instructions were provided to the                             patient.                           - Resume previous diet.                           - Continue present medications.                           - Repeat colonoscopy in 10 years for screening                            purposes. Mauri Pole, MD 03/02/2017 11:50:45 AM This report has been signed electronically.

## 2017-03-02 NOTE — Progress Notes (Signed)
Report given to PACU, vss 

## 2017-03-03 ENCOUNTER — Telehealth: Payer: Self-pay | Admitting: *Deleted

## 2017-03-03 NOTE — Telephone Encounter (Signed)
No answer for post procedure call back will attempt to call back later this afternoon. Sm

## 2017-03-03 NOTE — Telephone Encounter (Signed)
  Follow up Call-  Call back number 03/02/2017  Post procedure Call Back phone  # [phone]  Permission to leave phone message Yes  Some recent data might be hidden     Patient questions:  Do you have a fever, pain , or abdominal swelling? No. Pain Score  0 *  Have you tolerated food without any problems? Yes.    Have you been able to return to your normal activities? Yes.    Do you have any questions about your discharge instructions: Diet   No. Medications  No. Follow up visit  No.  Do you have questions or concerns about your Care? No.  Actions: * If pain score is 4 or above: No action needed, pain <4.

## 2017-07-04 ENCOUNTER — Encounter: Payer: Self-pay | Admitting: Family Medicine

## 2017-07-04 ENCOUNTER — Other Ambulatory Visit: Payer: Self-pay

## 2017-07-04 ENCOUNTER — Ambulatory Visit (INDEPENDENT_AMBULATORY_CARE_PROVIDER_SITE_OTHER): Payer: BLUE CROSS/BLUE SHIELD | Admitting: Family Medicine

## 2017-07-04 VITALS — BP 122/74 | HR 62 | Temp 98.4°F | Resp 14 | Ht 67.0 in | Wt 231.0 lb

## 2017-07-04 DIAGNOSIS — Z1239 Encounter for other screening for malignant neoplasm of breast: Secondary | ICD-10-CM

## 2017-07-04 DIAGNOSIS — Z0000 Encounter for general adult medical examination without abnormal findings: Secondary | ICD-10-CM

## 2017-07-04 DIAGNOSIS — E559 Vitamin D deficiency, unspecified: Secondary | ICD-10-CM | POA: Diagnosis not present

## 2017-07-04 DIAGNOSIS — Z1231 Encounter for screening mammogram for malignant neoplasm of breast: Secondary | ICD-10-CM

## 2017-07-04 DIAGNOSIS — E669 Obesity, unspecified: Secondary | ICD-10-CM | POA: Diagnosis not present

## 2017-07-04 MED ORDER — FLUTICASONE PROPIONATE 50 MCG/ACT NA SUSP: 2.0000 | Freq: Every day | NASAL | 3 refills | Status: DC

## 2017-07-04 MED ORDER — MONTELUKAST SODIUM 10 MG PO TABS: 10.0000 mg | ORAL_TABLET | Freq: Every day | ORAL | 3 refills | Status: DC

## 2017-07-04 MED ORDER — ALBUTEROL SULFATE HFA 108 (90 BASE) MCG/ACT IN AERS: 4.0000 | INHALATION_SPRAY | RESPIRATORY_TRACT | 2 refills | Status: DC | PRN

## 2017-07-04 MED ORDER — FLUTICASONE PROPIONATE HFA 110 MCG/ACT IN AERO: 2.0000 | INHALATION_SPRAY | Freq: Two times a day (BID) | RESPIRATORY_TRACT | 3 refills | Status: DC

## 2017-07-04 NOTE — Progress Notes (Signed)
   Subjective:    Patient ID: Stephanie Wall, female    DOB: 21-Apr-1956, 62 y.o.   MRN: [id number]  Patient presents for CPE (is not fasting)  Pt here for CPE  Immunizations- UTD declines flu and shingles vaccine   Colonoscopy- UTD  Mammogram- UTD March 2018   Medications reviewed  Hep C negative  PAP Smear UTD  2017     Dry skin all over which itches a lot, trying to drink more water     Asthma- using singulair , has not used albuterol / or nebulizer , Flovent        Review Of Systems:  GEN- denies fatigue, fever, weight loss,weakness, recent illness HEENT- denies eye drainage, change in vision, nasal discharge, CVS- denies chest pain, palpitations RESP- denies SOB, cough, wheeze ABD- denies N/V, change in stools, abd pain GU- denies dysuria, hematuria, dribbling, incontinence MSK- denies joint pain, muscle aches, injury Neuro- denies headache, dizziness, syncope, seizure activity       Objective:    BP 122/74   Pulse 62   Temp 98.4 F (36.9 C) (Oral)   Resp 14   Ht 5\' 7"  (1.702 m)   Wt 231 lb (104.8 kg)   SpO2 98%   BMI 36.18 kg/m  GEN- NAD, alert and oriented x3 HEENT- PERRL, EOMI, non injected sclera, pink conjunctiva, MMM, oropharynx clear Neck- Supple, no thyromegaly CVS- RRR, no murmur RESP-CTAB ABD-NABS,soft,NT,ND EXT- No edema Pulses- Radial, DP- 2+        Assessment & Plan:      Problem List Items Addressed This Visit      Unprioritized   Vitamin D deficiency   Relevant Orders   Vitamin D, 25-hydroxy   Obesity (BMI 30-39.9)   Routine general medical examination at a health care facility - Primary    CPE done, fasting labs, prevention UTD Asthma controlled, has maintenance inhalers Discussed returned to exercise ZUMBA, watching sugars and carbs      Relevant Orders   Comprehensive metabolic panel   CBC with Differential/Platelet   Lipid panel    Other Visit Diagnoses    Breast cancer screening       Relevant Orders   MM  SCREENING BREAST TOMO BILATERAL      Note: This dictation was prepared with Dragon dictation along with smaller phrase technology. Any transcriptional errors that result from this process are unintentional.

## 2017-07-04 NOTE — Patient Instructions (Addendum)
Schedule your Mammogram for end of March I recommend eye visit once a year I recommend dental visit every 6 months  We will call with results  F/U 1 year for physical

## 2017-07-05 ENCOUNTER — Encounter: Payer: Self-pay | Admitting: Family Medicine

## 2017-07-05 LAB — COMPREHENSIVE METABOLIC PANEL
AG Ratio: 1.6 (calc) (ref 1.0–2.5)
ALT: 17 U/L (ref 6–29)
AST: 19 U/L (ref 10–35)
Albumin: 4.1 g/dL (ref 3.6–5.1)
Alkaline phosphatase (APISO): 68 U/L (ref 33–130)
BUN: 10 mg/dL (ref 7–25)
CO2: 27 mmol/L (ref 20–32)
Calcium: 9.8 mg/dL (ref 8.6–10.4)
Chloride: 107 mmol/L (ref 98–110)
Creat: 0.92 mg/dL (ref 0.50–0.99)
Globulin: 2.5 g/dL (calc) (ref 1.9–3.7)
Glucose, Bld: 94 mg/dL (ref 65–99)
Potassium: 4.2 mmol/L (ref 3.5–5.3)
Sodium: 142 mmol/L (ref 135–146)
Total Bilirubin: 0.4 mg/dL (ref 0.2–1.2)
Total Protein: 6.6 g/dL (ref 6.1–8.1)

## 2017-07-05 LAB — CBC WITH DIFFERENTIAL/PLATELET
Basophils Absolute: 20 cells/uL (ref 0–200)
Basophils Relative: 0.4 %
Eosinophils Absolute: 113 cells/uL (ref 15–500)
Eosinophils Relative: 2.3 %
HCT: 38.4 % (ref 35.0–45.0)
Hemoglobin: 12.2 g/dL (ref 11.7–15.5)
Lymphs Abs: 2029 cells/uL (ref 850–3900)
MCH: 29.3 pg (ref 27.0–33.0)
MCHC: 31.8 g/dL — ABNORMAL LOW (ref 32.0–36.0)
MCV: 92.3 fL (ref 80.0–100.0)
MPV: 10.9 fL (ref 7.5–12.5)
Monocytes Relative: 8.2 %
Neutro Abs: 2337 cells/uL (ref 1500–7800)
Neutrophils Relative %: 47.7 %
Platelets: 230 10*3/uL (ref 140–400)
RBC: 4.16 10*6/uL (ref 3.80–5.10)
RDW: 16.2 % — ABNORMAL HIGH (ref 11.0–15.0)
Total Lymphocyte: 41.4 %
WBC mixed population: 402 cells/uL (ref 200–950)
WBC: 4.9 10*3/uL (ref 3.8–10.8)

## 2017-07-05 LAB — LIPID PANEL
Cholesterol: 177 mg/dL (ref <200)
HDL: 66 mg/dL (ref >50)
LDL Cholesterol (Calc): 95 mg/dL (calc)
Non-HDL Cholesterol (Calc): 111 mg/dL (calc) (ref <130)
Total CHOL/HDL Ratio: 2.7 (calc) (ref <5.0)
Triglycerides: 69 mg/dL (ref <150)

## 2017-07-05 NOTE — Assessment & Plan Note (Signed)
CPE done, fasting labs, prevention UTD Asthma controlled, has maintenance inhalers Discussed returned to exercise ZUMBA, watching sugars and carbs

## 2017-07-06 LAB — VITAMIN D 25 HYDROXY (VIT D DEFICIENCY, FRACTURES): Vit D, 25-Hydroxy: 24 ng/mL — ABNORMAL LOW (ref 30–100)

## 2017-07-21 ENCOUNTER — Ambulatory Visit
Admission: RE | Admit: 2017-07-21 | Discharge: 2017-07-21 | Disposition: A | Discharge status: Home / Self Care | Payer: BLUE CROSS/BLUE SHIELD | Source: Ambulatory Visit | Attending: Family Medicine | Admitting: Family Medicine

## 2017-07-21 DIAGNOSIS — Z1239 Encounter for other screening for malignant neoplasm of breast: Secondary | ICD-10-CM

## 2018-02-10 ENCOUNTER — Other Ambulatory Visit: Payer: Self-pay | Admitting: Allergy & Immunology

## 2018-02-12 NOTE — Telephone Encounter (Signed)
Denied refill Flovent 110 and fluticasone.  Per chart 11/16/2016, Return in about 3 months (around 02/16/2017).  Patient needs OV.

## 2018-03-17 ENCOUNTER — Emergency Department (HOSPITAL_COMMUNITY): Payer: BLUE CROSS/BLUE SHIELD

## 2018-03-17 ENCOUNTER — Emergency Department (HOSPITAL_COMMUNITY)
Admission: EM | Admit: 2018-03-17 | Discharge: 2018-03-17 | Disposition: A | Discharge status: Home / Self Care | Payer: BLUE CROSS/BLUE SHIELD | Attending: Emergency Medicine | Admitting: Emergency Medicine

## 2018-03-17 ENCOUNTER — Encounter (HOSPITAL_COMMUNITY): Payer: Self-pay

## 2018-03-17 DIAGNOSIS — M545 Low back pain, unspecified: Secondary | ICD-10-CM

## 2018-03-17 DIAGNOSIS — Z79899 Other long term (current) drug therapy: Secondary | ICD-10-CM | POA: Diagnosis not present

## 2018-03-17 DIAGNOSIS — J45909 Unspecified asthma, uncomplicated: Secondary | ICD-10-CM | POA: Diagnosis not present

## 2018-03-17 DIAGNOSIS — M542 Cervicalgia: Secondary | ICD-10-CM | POA: Diagnosis not present

## 2018-03-17 MED ORDER — ACETAMINOPHEN 325 MG PO TABS
650.0000 mg | ORAL_TABLET | Freq: Once | ORAL | Status: AC
  Administered 2018-03-17: 650 mg via ORAL
  Filled 2018-03-17: qty 2

## 2018-03-17 MED ORDER — METHOCARBAMOL 750 MG PO TABS: 750.0000 mg | ORAL_TABLET | Freq: Three times a day (TID) | ORAL | 0 refills | Status: DC | PRN
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
## 2018-03-17 NOTE — Discharge Instructions (Signed)
Please take Ibuprofen (Advil, motrin) and Tylenol (acetaminophen) to relieve your pain.  You may take up to 600 MG (3 pills) of normal strength ibuprofen every 8 hours as needed.  In between doses of ibuprofen you make take tylenol, up to 1,000 mg (two extra strength pills).  Do not take more than 3,000 mg tylenol in a 24 hour period.  Please check all medication labels as many medications such as pain and cold medications may contain tylenol.  Do not drink alcohol while taking these medications.  Do not take other NSAID'S while taking ibuprofen (such as aleve or naproxen).  Please take ibuprofen with food to decrease stomach upset.  You are being prescribed a medication which may make you sleepy. For 24 hours after one dose please do not drive, operate heavy machinery, care for a small child with out another adult present, or perform any activities that may cause harm to you or someone else if you were to fall asleep or be impaired.   The best way to get rid of muscle pain is by taking NSAIDS, using heat, massage therapy, and gentle stretching/range of motion exercises.  

## 2018-03-17 NOTE — ED Notes (Signed)
Patient transported to CT 

## 2018-03-27 DIAGNOSIS — M47892 Other spondylosis, cervical region: Secondary | ICD-10-CM | POA: Diagnosis not present

## 2018-03-27 DIAGNOSIS — M542 Cervicalgia: Secondary | ICD-10-CM | POA: Diagnosis not present

## 2018-03-27 DIAGNOSIS — M5030 Other cervical disc degeneration, unspecified cervical region: Secondary | ICD-10-CM | POA: Diagnosis not present

## 2018-03-28 ENCOUNTER — Ambulatory Visit: Payer: BLUE CROSS/BLUE SHIELD | Admitting: Family Medicine

## 2018-03-28 ENCOUNTER — Ambulatory Visit
Admission: RE | Admit: 2018-03-28 | Discharge: 2018-03-28 | Disposition: A | Discharge status: Home / Self Care | Payer: BLUE CROSS/BLUE SHIELD | Source: Ambulatory Visit | Attending: Family Medicine | Admitting: Family Medicine

## 2018-03-28 ENCOUNTER — Other Ambulatory Visit: Payer: Self-pay

## 2018-03-28 ENCOUNTER — Encounter: Payer: Self-pay | Admitting: Family Medicine

## 2018-03-28 VITALS — BP 120/62 | HR 72 | Temp 98.3°F | Resp 12 | Ht 67.0 in | Wt 231.0 lb

## 2018-03-28 DIAGNOSIS — S161XXD Strain of muscle, fascia and tendon at neck level, subsequent encounter: Secondary | ICD-10-CM | POA: Diagnosis not present

## 2018-03-28 DIAGNOSIS — S39012D Strain of muscle, fascia and tendon of lower back, subsequent encounter: Secondary | ICD-10-CM

## 2018-03-28 DIAGNOSIS — M549 Dorsalgia, unspecified: Secondary | ICD-10-CM | POA: Diagnosis not present

## 2018-03-28 DIAGNOSIS — M47814 Spondylosis without myelopathy or radiculopathy, thoracic region: Secondary | ICD-10-CM | POA: Diagnosis not present

## 2018-03-28 DIAGNOSIS — M4184 Other forms of scoliosis, thoracic region: Secondary | ICD-10-CM | POA: Diagnosis not present

## 2018-03-28 MED ORDER — TRAMADOL HCL 50 MG PO TABS: 50.0000 mg | ORAL_TABLET | Freq: Three times a day (TID) | ORAL | 0 refills | Status: DC | PRN

## 2018-03-28 NOTE — Patient Instructions (Addendum)
Take the prednisone, baclofen, ultram for pain Use ICE/Heat Call if things worse  [facility]  F/U 4 weeks
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
## 2018-04-16 ENCOUNTER — Other Ambulatory Visit: Payer: Self-pay | Admitting: Orthopaedic Surgery

## 2018-04-16 DIAGNOSIS — M542 Cervicalgia: Secondary | ICD-10-CM

## 2018-04-24 ENCOUNTER — Ambulatory Visit
Admission: RE | Admit: 2018-04-24 | Discharge: 2018-04-24 | Disposition: A | Discharge status: Home / Self Care | Payer: 59 | Source: Ambulatory Visit | Attending: Orthopaedic Surgery | Admitting: Orthopaedic Surgery

## 2018-04-24 DIAGNOSIS — M542 Cervicalgia: Secondary | ICD-10-CM

## 2018-04-24 DIAGNOSIS — M4802 Spinal stenosis, cervical region: Secondary | ICD-10-CM | POA: Diagnosis not present

## 2018-04-27 ENCOUNTER — Encounter: Payer: Self-pay | Admitting: Family Medicine

## 2018-04-27 ENCOUNTER — Other Ambulatory Visit: Payer: Self-pay

## 2018-04-27 ENCOUNTER — Ambulatory Visit: Payer: 59 | Admitting: Family Medicine

## 2018-04-27 VITALS — BP 122/68 | HR 60 | Temp 98.3°F | Resp 14 | Ht 67.0 in | Wt 229.0 lb

## 2018-04-27 DIAGNOSIS — R300 Dysuria: Secondary | ICD-10-CM

## 2018-04-27 DIAGNOSIS — N898 Other specified noninflammatory disorders of vagina: Secondary | ICD-10-CM

## 2018-04-27 DIAGNOSIS — S161XXD Strain of muscle, fascia and tendon at neck level, subsequent encounter: Secondary | ICD-10-CM

## 2018-04-27 LAB — WET PREP FOR TRICH, YEAST, CLUE

## 2018-04-27 LAB — URINALYSIS, ROUTINE W REFLEX MICROSCOPIC
Bilirubin Urine: NEGATIVE
Glucose, UA: NEGATIVE
Hgb urine dipstick: NEGATIVE
Ketones, ur: NEGATIVE
Leukocytes, UA: NEGATIVE
Nitrite: NEGATIVE
Protein, ur: NEGATIVE
Specific Gravity, Urine: 1.02 (ref 1.001–1.03)
pH: 5.5 (ref 5.0–8.0)
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
## 2018-04-27 NOTE — Patient Instructions (Addendum)
Keep appt on 1/14 with Dr. Patrice Paradise Plan for Physical Therapy  You can use Vagisil or Clotrimazole cream over the counter  F/U End of March for Physical

## 2018-04-28 ENCOUNTER — Encounter: Payer: Self-pay | Admitting: Family Medicine

## 2018-05-08 DIAGNOSIS — M47892 Other spondylosis, cervical region: Secondary | ICD-10-CM | POA: Diagnosis not present

## 2018-05-08 DIAGNOSIS — M5030 Other cervical disc degeneration, unspecified cervical region: Secondary | ICD-10-CM | POA: Diagnosis not present

## 2018-05-08 DIAGNOSIS — M542 Cervicalgia: Secondary | ICD-10-CM | POA: Diagnosis not present

## 2018-05-09 DIAGNOSIS — M542 Cervicalgia: Secondary | ICD-10-CM | POA: Diagnosis not present

## 2018-05-09 DIAGNOSIS — R531 Weakness: Secondary | ICD-10-CM | POA: Diagnosis not present

## 2018-05-09 DIAGNOSIS — R293 Abnormal posture: Secondary | ICD-10-CM | POA: Diagnosis not present

## 2018-05-15 DIAGNOSIS — R293 Abnormal posture: Secondary | ICD-10-CM | POA: Diagnosis not present

## 2018-05-15 DIAGNOSIS — M542 Cervicalgia: Secondary | ICD-10-CM | POA: Diagnosis not present

## 2018-05-15 DIAGNOSIS — R531 Weakness: Secondary | ICD-10-CM | POA: Diagnosis not present

## 2018-05-17 DIAGNOSIS — R531 Weakness: Secondary | ICD-10-CM | POA: Diagnosis not present

## 2018-05-17 DIAGNOSIS — R293 Abnormal posture: Secondary | ICD-10-CM | POA: Diagnosis not present

## 2018-05-17 DIAGNOSIS — M542 Cervicalgia: Secondary | ICD-10-CM | POA: Diagnosis not present

## 2018-05-21 ENCOUNTER — Other Ambulatory Visit: Payer: Self-pay | Admitting: Family Medicine

## 2018-05-22 DIAGNOSIS — R293 Abnormal posture: Secondary | ICD-10-CM | POA: Diagnosis not present

## 2018-05-22 DIAGNOSIS — M542 Cervicalgia: Secondary | ICD-10-CM | POA: Diagnosis not present

## 2018-05-22 DIAGNOSIS — R531 Weakness: Secondary | ICD-10-CM | POA: Diagnosis not present

## 2018-05-24 DIAGNOSIS — R293 Abnormal posture: Secondary | ICD-10-CM | POA: Diagnosis not present

## 2018-05-24 DIAGNOSIS — R531 Weakness: Secondary | ICD-10-CM | POA: Diagnosis not present

## 2018-05-24 DIAGNOSIS — M542 Cervicalgia: Secondary | ICD-10-CM | POA: Diagnosis not present

## 2018-05-29 DIAGNOSIS — R531 Weakness: Secondary | ICD-10-CM | POA: Diagnosis not present

## 2018-05-29 DIAGNOSIS — M542 Cervicalgia: Secondary | ICD-10-CM | POA: Diagnosis not present

## 2018-05-29 DIAGNOSIS — R293 Abnormal posture: Secondary | ICD-10-CM | POA: Diagnosis not present

## 2018-05-31 DIAGNOSIS — M542 Cervicalgia: Secondary | ICD-10-CM | POA: Diagnosis not present

## 2018-05-31 DIAGNOSIS — R293 Abnormal posture: Secondary | ICD-10-CM | POA: Diagnosis not present

## 2018-05-31 DIAGNOSIS — R531 Weakness: Secondary | ICD-10-CM | POA: Diagnosis not present

## 2018-06-05 DIAGNOSIS — R293 Abnormal posture: Secondary | ICD-10-CM | POA: Diagnosis not present

## 2018-06-05 DIAGNOSIS — R531 Weakness: Secondary | ICD-10-CM | POA: Diagnosis not present

## 2018-06-05 DIAGNOSIS — M542 Cervicalgia: Secondary | ICD-10-CM | POA: Diagnosis not present

## 2018-06-07 DIAGNOSIS — R293 Abnormal posture: Secondary | ICD-10-CM | POA: Diagnosis not present

## 2018-06-07 DIAGNOSIS — M542 Cervicalgia: Secondary | ICD-10-CM | POA: Diagnosis not present

## 2018-06-07 DIAGNOSIS — R531 Weakness: Secondary | ICD-10-CM | POA: Diagnosis not present

## 2018-06-08 ENCOUNTER — Ambulatory Visit (INDEPENDENT_AMBULATORY_CARE_PROVIDER_SITE_OTHER): Payer: 59 | Admitting: Family Medicine

## 2018-06-08 ENCOUNTER — Other Ambulatory Visit: Payer: Self-pay

## 2018-06-08 ENCOUNTER — Encounter: Payer: Self-pay | Admitting: Family Medicine

## 2018-06-08 VITALS — BP 110/78 | HR 83 | Temp 99.2°F | Ht 67.0 in | Wt 233.0 lb

## 2018-06-08 DIAGNOSIS — R5383 Other fatigue: Secondary | ICD-10-CM | POA: Diagnosis not present

## 2018-06-08 DIAGNOSIS — J069 Acute upper respiratory infection, unspecified: Secondary | ICD-10-CM

## 2018-06-08 DIAGNOSIS — R509 Fever, unspecified: Secondary | ICD-10-CM | POA: Diagnosis not present

## 2018-06-08 DIAGNOSIS — J4520 Mild intermittent asthma, uncomplicated: Secondary | ICD-10-CM

## 2018-06-08 LAB — INFLUENZA A AND B AG, IMMUNOASSAY
INFLUENZA A ANTIGEN: NOT DETECTED
INFLUENZA B ANTIGEN: NOT DETECTED

## 2018-06-08 MED ORDER — AZITHROMYCIN 250 MG PO TABS: ORAL_TABLET | ORAL | 0 refills | Status: DC

## 2018-06-08 MED ORDER — PREDNISONE 20 MG PO TABS: 40.0000 mg | ORAL_TABLET | Freq: Every day | ORAL | 0 refills | Status: DC

## 2018-06-08 NOTE — Progress Notes (Signed)
   Subjective:    Patient ID: Stephanie Wall, female    DOB: [birth year], 63 y.o.   MRN: [id number]  Patient presents for Fatigue  Pt here with subjective fever, fatigue, sinus drainage and pressure, mild dry cough started yesterday. She has taken nasal spray and allergy pill but that has not helped No specific sick contacts  Has used albuterol inhaler  Had nausea  Yesterday, no vomiting or diarrhea   She has underlying asthma    Review Of Systems:  GEN- + fatigue,+ fever, weight loss,weakness, recent illness HEENT- denies eye drainage, change in vision, +nasal discharge, CVS- denies chest pain, palpitations RESP- denies SOB, +cough, wheeze ABD- denies N/V, change in stools, abd pain GU- denies dysuria, hematuria, dribbling, incontinence MSK- denies joint pain, muscle aches, injury Neuro- denies headache, dizziness, syncope, seizure activity       Objective:    BP 110/78   Pulse 83   Temp 99.2 F (37.3 C) (Oral)   Ht 5\' 7"  (1.702 m)   Wt 233 lb (105.7 kg)   SpO2 99%   BMI 36.49 kg/m  GEN- NAD, alert and oriented x3, non toxic appearing  HEENT- PERRL, EOMI, non injected sclera, pink conjunctiva, MMM, oropharynx clear, TM clear no effusion,nares clear  Neck- Supple, no thyromegaly, no LAD CVS- RRR, no murmur RESP-CTAB ABD-NABS,soft,NT,ND Pulses- Radial 2+        Assessment & Plan:      Problem List Items Addressed This Visit      Unprioritized   Mild intermittent asthma, uncomplicated   Relevant Medications   predniSONE (DELTASONE) 20 MG tablet    Other Visit Diagnoses    Acute URI    -  Primary   More viral URI, VIral sinusitis, but has underlying asthma, no sign of acute exacerbation, have her use allergy meds, nasal sray, given prednisone if she does start to have bronchospasm, zpak given for sinusitsi if not improved by next week, she is not to start currently as viral in nature.    Relevant Medications   azithromycin (ZITHROMAX) 250 MG tablet   Other  Relevant Orders   Influenza A and B Ag, Immunoassay (Completed)      Note: This dictation was prepared with Dragon dictation along with smaller phrase technology. Any transcriptional errors that result from this process are unintentional.

## 2018-06-08 NOTE — Patient Instructions (Addendum)
Use prednisone and take antibiotics if you do not improve by Monday  COntinue ALERGY meds Use mucinex DM   F/U as previous

## 2018-06-10 ENCOUNTER — Encounter: Payer: Self-pay | Admitting: Family Medicine

## 2018-06-19 DIAGNOSIS — R293 Abnormal posture: Secondary | ICD-10-CM | POA: Diagnosis not present

## 2018-06-19 DIAGNOSIS — R531 Weakness: Secondary | ICD-10-CM | POA: Diagnosis not present

## 2018-06-19 DIAGNOSIS — M542 Cervicalgia: Secondary | ICD-10-CM | POA: Diagnosis not present

## 2018-06-21 DIAGNOSIS — M542 Cervicalgia: Secondary | ICD-10-CM | POA: Diagnosis not present

## 2018-06-21 DIAGNOSIS — R531 Weakness: Secondary | ICD-10-CM | POA: Diagnosis not present

## 2018-06-21 DIAGNOSIS — R293 Abnormal posture: Secondary | ICD-10-CM | POA: Diagnosis not present

## 2018-06-25 DIAGNOSIS — R531 Weakness: Secondary | ICD-10-CM | POA: Diagnosis not present

## 2018-06-25 DIAGNOSIS — R293 Abnormal posture: Secondary | ICD-10-CM | POA: Diagnosis not present

## 2018-06-25 DIAGNOSIS — M542 Cervicalgia: Secondary | ICD-10-CM | POA: Diagnosis not present

## 2018-06-27 DIAGNOSIS — R531 Weakness: Secondary | ICD-10-CM | POA: Diagnosis not present

## 2018-06-27 DIAGNOSIS — R293 Abnormal posture: Secondary | ICD-10-CM | POA: Diagnosis not present

## 2018-06-27 DIAGNOSIS — M542 Cervicalgia: Secondary | ICD-10-CM | POA: Diagnosis not present

## 2018-07-02 DIAGNOSIS — M542 Cervicalgia: Secondary | ICD-10-CM | POA: Diagnosis not present

## 2018-07-02 DIAGNOSIS — R531 Weakness: Secondary | ICD-10-CM | POA: Diagnosis not present

## 2018-07-02 DIAGNOSIS — M5030 Other cervical disc degeneration, unspecified cervical region: Secondary | ICD-10-CM | POA: Diagnosis not present

## 2018-07-02 DIAGNOSIS — R293 Abnormal posture: Secondary | ICD-10-CM | POA: Diagnosis not present

## 2018-07-03 ENCOUNTER — Other Ambulatory Visit: Payer: Self-pay | Admitting: Family Medicine

## 2018-07-03 DIAGNOSIS — Z1231 Encounter for screening mammogram for malignant neoplasm of breast: Secondary | ICD-10-CM

## 2018-07-04 ENCOUNTER — Other Ambulatory Visit: Payer: Self-pay | Admitting: Family Medicine

## 2018-07-04 ENCOUNTER — Other Ambulatory Visit: Payer: Self-pay

## 2018-07-04 ENCOUNTER — Other Ambulatory Visit: Payer: 59

## 2018-07-04 DIAGNOSIS — Z0000 Encounter for general adult medical examination without abnormal findings: Secondary | ICD-10-CM

## 2018-07-04 DIAGNOSIS — M542 Cervicalgia: Secondary | ICD-10-CM | POA: Diagnosis not present

## 2018-07-04 DIAGNOSIS — E559 Vitamin D deficiency, unspecified: Secondary | ICD-10-CM

## 2018-07-04 DIAGNOSIS — R293 Abnormal posture: Secondary | ICD-10-CM | POA: Diagnosis not present

## 2018-07-04 DIAGNOSIS — R531 Weakness: Secondary | ICD-10-CM | POA: Diagnosis not present

## 2018-07-05 LAB — CBC WITH DIFFERENTIAL/PLATELET
Absolute Monocytes: 440 cells/uL (ref 200–950)
Basophils Absolute: 20 cells/uL (ref 0–200)
Basophils Relative: 0.4 %
Eosinophils Absolute: 110 cells/uL (ref 15–500)
Eosinophils Relative: 2.2 %
HCT: 37.2 % (ref 35.0–45.0)
Hemoglobin: 12 g/dL (ref 11.7–15.5)
Lymphs Abs: 1900 cells/uL (ref 850–3900)
MCH: 29.3 pg (ref 27.0–33.0)
MCHC: 32.3 g/dL (ref 32.0–36.0)
MCV: 90.7 fL (ref 80.0–100.0)
MPV: 11.1 fL (ref 7.5–12.5)
Monocytes Relative: 8.8 %
Neutro Abs: 2530 cells/uL (ref 1500–7800)
Neutrophils Relative %: 50.6 %
Platelets: 253 10*3/uL (ref 140–400)
RBC: 4.1 10*6/uL (ref 3.80–5.10)
RDW: 14.2 % (ref 11.0–15.0)
Total Lymphocyte: 38 %
WBC: 5 10*3/uL (ref 3.8–10.8)

## 2018-07-05 LAB — COMPREHENSIVE METABOLIC PANEL
AG Ratio: 1.3 (calc) (ref 1.0–2.5)
ALT: 18 U/L (ref 6–29)
AST: 20 U/L (ref 10–35)
Albumin: 4 g/dL (ref 3.6–5.1)
Alkaline phosphatase (APISO): 67 U/L (ref 37–153)
BUN/Creatinine Ratio: 13 (calc) (ref 6–22)
BUN: 13 mg/dL (ref 7–25)
CO2: 29 mmol/L (ref 20–32)
Calcium: 9.6 mg/dL (ref 8.6–10.4)
Chloride: 107 mmol/L (ref 98–110)
Creat: 1 mg/dL — ABNORMAL HIGH (ref 0.50–0.99)
Globulin: 3 g/dL (calc) (ref 1.9–3.7)
Glucose, Bld: 95 mg/dL (ref 65–99)
Potassium: 4.3 mmol/L (ref 3.5–5.3)
Sodium: 142 mmol/L (ref 135–146)
Total Bilirubin: 0.4 mg/dL (ref 0.2–1.2)
Total Protein: 7 g/dL (ref 6.1–8.1)

## 2018-07-05 LAB — LIPID PANEL
Cholesterol: 164 mg/dL (ref <200)
HDL: 66 mg/dL (ref >=50)
LDL Cholesterol (Calc): 83 mg/dL (calc)
Non-HDL Cholesterol (Calc): 98 mg/dL (calc) (ref <130)
Total CHOL/HDL Ratio: 2.5 (calc) (ref <5.0)
Triglycerides: 71 mg/dL (ref <150)

## 2018-07-05 LAB — VITAMIN D 25 HYDROXY (VIT D DEFICIENCY, FRACTURES): Vit D, 25-Hydroxy: 30 ng/mL (ref 30–100)

## 2018-07-05 LAB — TSH: TSH: 2.46 mIU/L (ref 0.40–4.50)

## 2018-07-09 ENCOUNTER — Encounter: Payer: BLUE CROSS/BLUE SHIELD | Admitting: Family Medicine

## 2018-07-17 DIAGNOSIS — M542 Cervicalgia: Secondary | ICD-10-CM | POA: Diagnosis not present

## 2018-07-17 DIAGNOSIS — R531 Weakness: Secondary | ICD-10-CM | POA: Diagnosis not present

## 2018-07-17 DIAGNOSIS — R293 Abnormal posture: Secondary | ICD-10-CM | POA: Diagnosis not present

## 2018-07-25 DIAGNOSIS — M542 Cervicalgia: Secondary | ICD-10-CM | POA: Diagnosis not present

## 2018-07-25 DIAGNOSIS — R293 Abnormal posture: Secondary | ICD-10-CM | POA: Diagnosis not present

## 2018-07-25 DIAGNOSIS — R531 Weakness: Secondary | ICD-10-CM | POA: Diagnosis not present

## 2018-08-01 DIAGNOSIS — M542 Cervicalgia: Secondary | ICD-10-CM | POA: Diagnosis not present

## 2018-08-01 DIAGNOSIS — R531 Weakness: Secondary | ICD-10-CM | POA: Diagnosis not present

## 2018-08-01 DIAGNOSIS — R293 Abnormal posture: Secondary | ICD-10-CM | POA: Diagnosis not present

## 2018-08-03 ENCOUNTER — Ambulatory Visit: Payer: 59

## 2018-08-15 ENCOUNTER — Other Ambulatory Visit: Payer: Self-pay | Admitting: *Deleted

## 2018-08-20 DIAGNOSIS — M542 Cervicalgia: Secondary | ICD-10-CM | POA: Diagnosis not present

## 2018-08-20 DIAGNOSIS — M47892 Other spondylosis, cervical region: Secondary | ICD-10-CM | POA: Diagnosis not present

## 2018-09-21 ENCOUNTER — Ambulatory Visit: Payer: 59

## 2019-02-04 ENCOUNTER — Other Ambulatory Visit: Payer: Self-pay | Admitting: *Deleted

## 2019-02-16 ENCOUNTER — Other Ambulatory Visit: Payer: Self-pay

## 2019-02-16 DIAGNOSIS — Z20822 Contact with and (suspected) exposure to covid-19: Secondary | ICD-10-CM

## 2019-02-18 LAB — NOVEL CORONAVIRUS, NAA: SARS-CoV-2, NAA: NOT DETECTED

## 2019-05-11 ENCOUNTER — Other Ambulatory Visit: Payer: Self-pay

## 2019-05-11 DIAGNOSIS — Z20822 Contact with and (suspected) exposure to covid-19: Secondary | ICD-10-CM | POA: Diagnosis not present

## 2019-05-12 LAB — NOVEL CORONAVIRUS, NAA: SARS-CoV-2, NAA: NOT DETECTED

## 2019-06-12 DIAGNOSIS — Z20828 Contact with and (suspected) exposure to other viral communicable diseases: Secondary | ICD-10-CM | POA: Diagnosis not present

## 2019-06-16 ENCOUNTER — Ambulatory Visit: Payer: Medicare HMO | Attending: Internal Medicine

## 2019-06-16 DIAGNOSIS — Z23 Encounter for immunization: Secondary | ICD-10-CM

## 2019-06-16 NOTE — Progress Notes (Signed)
   Covid-19 Vaccination Clinic  Name:  Raegann Maiorana    MRN: [id number] DOB: 13-May-1955  06/16/2019  Ms. Stickley was observed post Covid-19 immunization for 15 minutes without incidence. She was provided with Vaccine Information Sheet and instruction to access the V-Safe system.   Ms. Etcitty was instructed to call 911 with any severe reactions post vaccine: [name] Difficulty breathing  . Swelling of your face and throat  . A fast heartbeat  . A bad rash all over your body  . Dizziness and weakness    Immunizations Administered    Name Date Dose VIS Date Route   Pfizer COVID-19 Vaccine 06/16/2019  1:30 PM 0.3 mL 04/05/2019 Intramuscular   Manufacturer: [place]   Lot: [id number]   [place]: [id number]

## 2019-06-17 ENCOUNTER — Other Ambulatory Visit: Payer: Self-pay

## 2019-06-17 ENCOUNTER — Other Ambulatory Visit: Payer: Medicare HMO

## 2019-06-17 DIAGNOSIS — Z136 Encounter for screening for cardiovascular disorders: Secondary | ICD-10-CM | POA: Diagnosis not present

## 2019-06-17 DIAGNOSIS — Z13220 Encounter for screening for lipoid disorders: Secondary | ICD-10-CM | POA: Diagnosis not present

## 2019-06-17 DIAGNOSIS — E559 Vitamin D deficiency, unspecified: Secondary | ICD-10-CM

## 2019-06-17 DIAGNOSIS — Z1329 Encounter for screening for other suspected endocrine disorder: Secondary | ICD-10-CM | POA: Diagnosis not present

## 2019-06-17 DIAGNOSIS — Z0000 Encounter for general adult medical examination without abnormal findings: Secondary | ICD-10-CM

## 2019-06-17 DIAGNOSIS — R6889 Other general symptoms and signs: Secondary | ICD-10-CM | POA: Diagnosis not present

## 2019-06-18 LAB — LIPID PANEL
Cholesterol: 145 mg/dL (ref <200)
HDL: 49 mg/dL — ABNORMAL LOW (ref >=50)
LDL Cholesterol (Calc): 81 mg/dL (calc)
Non-HDL Cholesterol (Calc): 96 mg/dL (calc) (ref <130)
Total CHOL/HDL Ratio: 3 (calc) (ref <5.0)
Triglycerides: 66 mg/dL (ref <150)

## 2019-06-18 LAB — CBC WITH DIFFERENTIAL/PLATELET
Absolute Monocytes: 324 cells/uL (ref 200–950)
Basophils Absolute: 32 cells/uL (ref 0–200)
Basophils Relative: 0.7 %
Eosinophils Absolute: 113 cells/uL (ref 15–500)
Eosinophils Relative: 2.5 %
HCT: 37.3 % (ref 35.0–45.0)
Hemoglobin: 12.1 g/dL (ref 11.7–15.5)
Lymphs Abs: 1832 cells/uL (ref 850–3900)
MCH: 29.8 pg (ref 27.0–33.0)
MCHC: 32.4 g/dL (ref 32.0–36.0)
MCV: 91.9 fL (ref 80.0–100.0)
MPV: 11.2 fL (ref 7.5–12.5)
Monocytes Relative: 7.2 %
Neutro Abs: 2201 cells/uL (ref 1500–7800)
Neutrophils Relative %: 48.9 %
Platelets: 206 10*3/uL (ref 140–400)
RBC: 4.06 10*6/uL (ref 3.80–5.10)
RDW: 14.2 % (ref 11.0–15.0)
Total Lymphocyte: 40.7 %
WBC: 4.5 10*3/uL (ref 3.8–10.8)

## 2019-06-18 LAB — COMPLETE METABOLIC PANEL WITH GFR
AG Ratio: 1.5 (calc) (ref 1.0–2.5)
ALT: 16 U/L (ref 6–29)
AST: 19 U/L (ref 10–35)
Albumin: 4 g/dL (ref 3.6–5.1)
Alkaline phosphatase (APISO): 55 U/L (ref 37–153)
BUN: 13 mg/dL (ref 7–25)
CO2: 26 mmol/L (ref 20–32)
Calcium: 9.5 mg/dL (ref 8.6–10.4)
Chloride: 106 mmol/L (ref 98–110)
Creat: 0.97 mg/dL (ref 0.50–0.99)
GFR, Est African American: 71 mL/min/{1.73_m2} (ref >=60)
GFR, Est Non African American: 61 mL/min/{1.73_m2} (ref >=60)
Globulin: 2.6 g/dL (calc) (ref 1.9–3.7)
Glucose, Bld: 87 mg/dL (ref 65–99)
Potassium: 4.5 mmol/L (ref 3.5–5.3)
Sodium: 141 mmol/L (ref 135–146)
Total Bilirubin: 0.6 mg/dL (ref 0.2–1.2)
Total Protein: 6.6 g/dL (ref 6.1–8.1)

## 2019-06-18 LAB — TSH: TSH: 1.35 mIU/L (ref 0.40–4.50)

## 2019-06-18 LAB — VITAMIN D 25 HYDROXY (VIT D DEFICIENCY, FRACTURES): Vit D, 25-Hydroxy: 35 ng/mL (ref 30–100)

## 2019-06-24 ENCOUNTER — Other Ambulatory Visit: Payer: Self-pay

## 2019-06-24 ENCOUNTER — Ambulatory Visit (INDEPENDENT_AMBULATORY_CARE_PROVIDER_SITE_OTHER): Payer: Medicare HMO | Admitting: Family Medicine

## 2019-06-24 ENCOUNTER — Encounter: Payer: Self-pay | Admitting: Family Medicine

## 2019-06-24 VITALS — BP 124/66 | HR 64 | Temp 98.6°F | Resp 16 | Ht 67.0 in | Wt 236.0 lb

## 2019-06-24 DIAGNOSIS — Z1231 Encounter for screening mammogram for malignant neoplasm of breast: Secondary | ICD-10-CM | POA: Diagnosis not present

## 2019-06-24 DIAGNOSIS — R5383 Other fatigue: Secondary | ICD-10-CM

## 2019-06-24 DIAGNOSIS — J301 Allergic rhinitis due to pollen: Secondary | ICD-10-CM

## 2019-06-24 DIAGNOSIS — J4520 Mild intermittent asthma, uncomplicated: Secondary | ICD-10-CM

## 2019-06-24 DIAGNOSIS — E669 Obesity, unspecified: Secondary | ICD-10-CM

## 2019-06-24 DIAGNOSIS — Z0000 Encounter for general adult medical examination without abnormal findings: Secondary | ICD-10-CM

## 2019-06-24 DIAGNOSIS — Z124 Encounter for screening for malignant neoplasm of cervix: Secondary | ICD-10-CM

## 2019-06-24 DIAGNOSIS — N951 Menopausal and female climacteric states: Secondary | ICD-10-CM | POA: Diagnosis not present

## 2019-06-24 DIAGNOSIS — E559 Vitamin D deficiency, unspecified: Secondary | ICD-10-CM

## 2019-06-24 MED ORDER — FLOVENT HFA 110 MCG/ACT IN AERO: 2.0000 | INHALATION_SPRAY | Freq: Two times a day (BID) | RESPIRATORY_TRACT | 11 refills | Status: DC

## 2019-06-24 MED ORDER — ALBUTEROL SULFATE HFA 108 (90 BASE) MCG/ACT IN AERS: 4.0000 | INHALATION_SPRAY | RESPIRATORY_TRACT | 2 refills | Status: DC | PRN

## 2019-06-24 MED ORDER — MONTELUKAST SODIUM 10 MG PO TABS: ORAL_TABLET | ORAL | 3 refills | Status: DC

## 2019-06-24 NOTE — Progress Notes (Signed)
Subjective:   Patient presents for Medicare Annual/Subsequent preventive examination.    Pt here to f/u chronic medical problems and for annual wellness  Her Husband had surgery 3 weeks ago on his eyes.  She has been very busy driving him around and helping care for him the past few months which is been stressful on her.  She also watches her 66-year-old granddaughter   Asthma -  Taking flovent intermitant   And singulair intermittantly she does think that her breathing in some time her dizziness is better when she does use her inhaler regularly.   Occ feels dizzy head, no syncope, feels fatigued a lot, she will on occ get a loud ringing in her ears at times before she feels dizzy but it only lasts a couple seconds.  Denies any severe headaches denies any chest pain shortness of breath.    Vision has been normal     She has not been taking vitamin D inconsistently    Review Past Medical/Family/Social: Per EMR    Risk Factors  Current exercise habits:  None  Dietary issues discussed: Yes  Cardiac risk factors: Obesity (BMI >= 30 kg/m2).   Depression Screen  (Note: if answer to either of the following is "Yes", a more complete depression screening is indicated)  Over the past two weeks, have you felt down, depressed or hopeless? No Over the past two weeks, have you felt little interest or pleasure in doing things? No Have you lost interest or pleasure in daily life? No Do you often feel hopeless? No Do you cry easily over simple problems? No   Activities of Daily Living  In your present state of health, do you have any difficulty performing the following activities?:  Driving? No  Managing money? No  Feeding yourself? No  Getting from bed to chair? No  Climbing a flight of stairs? No  Preparing food and eating?: No  Bathing or showering? No  Getting dressed: No  Getting to the toilet? No  Using the toilet:No  Moving around from place to place: No  In the past year have you  fallen or had a near fall?:No  Are you sexually active? No  Do you have more than one partner? No   Hearing Difficulties: No  Do you often ask people to speak up or repeat themselves? No  Do you experience ringing or noises in your ears? No Do you have difficulty understanding soft or whispered voices? No  Do you feel that you have a problem with memory? No Do you often misplace items? No  Do you feel safe at home? Yes  Cognitive Testing  Alert? Yes Normal Appearance?Yes  Oriented to person? Yes Place? Yes  Time? Yes  Recall of three objects? Yes  Can perform simple calculations? Yes  Displays appropriate judgment?Yes  Can read the correct time from a watch face?Yes   List the Names of Other Physician/Practitioners you currently use:  None   Screening Tests / Date Colonoscopy  UTD 2018                    Pneumonia UTD  Mammogram  Due  PAP Smear last done in 2017 Influenza Vaccine UTD Tetanus/tdap UTD  COVID-19 - has 2nd shot done on March 17th    ROS:  GEN- denies fatigue, fever, weight loss,weakness, recent illness HEENT- denies eye drainage, change in vision, nasal discharge, CVS- denies chest pain, palpitations RESP- denies SOB, cough, wheeze ABD- denies N/V, change in  stools, abd pain GU- denies dysuria, hematuria, dribbling, incontinence MSK- denies joint pain, muscle aches, injury Neuro- denies headache, dizziness, syncope, seizure activity  Physical: Vitals reviewed  GEN- NAD, alert and oriented x3 HEENT- PERRL, EOMI, non injected sclera, pink conjunctiva, MMM, oropharynx clear, nares clear  Neck- Supple, no thryomegaly, no bruit  CVS- RRR, no murmur RESP-CTAB ABD-NABS,soft,NTND Psych- normal affect and mood NEURO-CNII-XII grossly in tact  EXT- No edema Pulses- Radial, DP- 2+   Assessment:    Annual wellness medicare exam   Plan:    During the course of the visit the patient was educated and counseled about appropriate screening and preventive  services including:  Screening mammography / Bone Density- pt to schedule Immunizations - she is getting her second Covid vaccine and about 2 weeks ago we will hold off on pneumonia vaccine today.  She can come into the office and get this in the next 1 to 2 months.  We also discussed shingles vaccine and she wants to get that in the near future.  Asthma recommend that she do take her medications regularly.  I refill the Flovent and the Singulair  Recommend she take vitamin D daily with history of vitamin D deficiency.  Cervical cancer screening last Pap smear obtained today as she does have history of abnormal Pap smear in the past.  Given handout on advanced directives  Obesity discussed increasing her activity we discussed decreasing some of her unhealthy carbohydrates.  For fatigue nonspecific per above with dietary changes and activity.  She can also add B12  She mentioned at the end of the day she will get some swelling on her ankles where she is up on her feet a lot.  Advised that she can wear compression hose this will help. Medicare Attestation  I have personally reviewed:  The patient's medical and social history  Their use of alcohol, tobacco or illicit drugs  Their current medications and supplements  The patient's functional ability including ADLs,fall risks, home safety risks, cognitive, and hearing and visual impairment  Diet and physical activities  Evidence for depression or mood disorders  The patient's weight, height, BMI, and visual acuity have been recorded in the chart. I have made referrals, counseling, and provided education to the patient based on review of the above and I have provided the patient with a written personalized care plan for preventive services.

## 2019-06-24 NOTE — Patient Instructions (Addendum)
Add B12 to your regimen 1000mg  once a day Take the vitamin D daily Restart allergy and asthma medications Increase water Schedule your mammogram and Bone Density  Wear the compression hose  You can get the pneumonia shot in 1-2 months nurse visit  F/U 1 year for physical

## 2019-06-25 ENCOUNTER — Encounter: Payer: Self-pay | Admitting: Family Medicine

## 2019-06-25 LAB — PAP IG W/ RFLX HPV ASCU

## 2019-06-26 ENCOUNTER — Encounter: Payer: Self-pay | Admitting: *Deleted

## 2019-07-10 ENCOUNTER — Ambulatory Visit: Payer: Medicare HMO | Attending: Internal Medicine

## 2019-07-10 DIAGNOSIS — Z23 Encounter for immunization: Secondary | ICD-10-CM

## 2019-07-10 NOTE — Progress Notes (Signed)
   Covid-19 Vaccination Clinic  Name:  Stephanie Wall    MRN: [id number] DOB: 21-Jul-1955  07/10/2019  Stephanie Wall was observed post Covid-19 immunization for 15 minutes without incident. She was provided with Vaccine Information Sheet and instruction to access the V-Safe system.   Stephanie Wall was instructed to call 911 with any severe reactions post vaccine: [name] Difficulty breathing  . Swelling of face and throat  . A fast heartbeat  . A bad rash all over body  . Dizziness and weakness   Immunizations Administered    Name Date Dose VIS Date Route   Pfizer COVID-19 Vaccine 07/10/2019  9:45 AM 0.3 mL 04/05/2019 Intramuscular   Manufacturer: [employer]   Lot: [id number]   [place]: [id number]

## 2019-07-29 ENCOUNTER — Ambulatory Visit
Admission: RE | Admit: 2019-07-29 | Discharge: 2019-07-29 | Disposition: A | Discharge status: Home / Self Care | Payer: Medicare HMO | Source: Ambulatory Visit | Attending: Family Medicine | Admitting: Family Medicine

## 2019-07-29 ENCOUNTER — Other Ambulatory Visit: Payer: Self-pay

## 2019-07-29 DIAGNOSIS — Z1231 Encounter for screening mammogram for malignant neoplasm of breast: Secondary | ICD-10-CM

## 2019-09-26 ENCOUNTER — Other Ambulatory Visit: Payer: Self-pay | Admitting: Family Medicine

## 2019-09-26 DIAGNOSIS — Z13820 Encounter for screening for osteoporosis: Secondary | ICD-10-CM

## 2019-09-30 ENCOUNTER — Other Ambulatory Visit: Payer: Self-pay

## 2019-09-30 ENCOUNTER — Ambulatory Visit
Admission: RE | Admit: 2019-09-30 | Discharge: 2019-09-30 | Disposition: A | Discharge status: Home / Self Care | Payer: Medicare HMO | Source: Ambulatory Visit | Attending: Family Medicine | Admitting: Family Medicine

## 2019-09-30 ENCOUNTER — Encounter: Payer: Self-pay | Admitting: Family Medicine

## 2019-09-30 DIAGNOSIS — Z13820 Encounter for screening for osteoporosis: Secondary | ICD-10-CM

## 2019-09-30 DIAGNOSIS — Z780 Asymptomatic menopausal state: Secondary | ICD-10-CM | POA: Diagnosis not present

## 2019-09-30 DIAGNOSIS — M8580 Other specified disorders of bone density and structure, unspecified site: Secondary | ICD-10-CM | POA: Insufficient documentation

## 2019-09-30 DIAGNOSIS — M85852 Other specified disorders of bone density and structure, left thigh: Secondary | ICD-10-CM | POA: Diagnosis not present

## 2019-10-01 ENCOUNTER — Encounter: Payer: Self-pay | Admitting: *Deleted

## 2019-10-10 DIAGNOSIS — H524 Presbyopia: Secondary | ICD-10-CM | POA: Diagnosis not present

## 2019-10-10 DIAGNOSIS — H5203 Hypermetropia, bilateral: Secondary | ICD-10-CM | POA: Diagnosis not present

## 2019-10-10 DIAGNOSIS — H52223 Regular astigmatism, bilateral: Secondary | ICD-10-CM | POA: Diagnosis not present

## 2019-10-23 DIAGNOSIS — R69 Illness, unspecified: Secondary | ICD-10-CM | POA: Diagnosis not present

## 2020-01-02 DIAGNOSIS — Z20822 Contact with and (suspected) exposure to covid-19: Secondary | ICD-10-CM | POA: Diagnosis not present

## 2020-01-08 ENCOUNTER — Encounter: Payer: Self-pay | Admitting: Family Medicine

## 2020-01-08 ENCOUNTER — Ambulatory Visit (INDEPENDENT_AMBULATORY_CARE_PROVIDER_SITE_OTHER): Payer: Medicare HMO | Admitting: Family Medicine

## 2020-01-08 ENCOUNTER — Other Ambulatory Visit: Payer: Self-pay

## 2020-01-08 VITALS — BP 122/60 | HR 62 | Temp 97.8°F | Resp 14 | Ht 67.0 in | Wt 233.0 lb

## 2020-01-08 DIAGNOSIS — R339 Retention of urine, unspecified: Secondary | ICD-10-CM

## 2020-01-08 DIAGNOSIS — Z23 Encounter for immunization: Secondary | ICD-10-CM

## 2020-01-08 DIAGNOSIS — J3489 Other specified disorders of nose and nasal sinuses: Secondary | ICD-10-CM

## 2020-01-08 DIAGNOSIS — R1310 Dysphagia, unspecified: Secondary | ICD-10-CM | POA: Diagnosis not present

## 2020-01-08 DIAGNOSIS — K219 Gastro-esophageal reflux disease without esophagitis: Secondary | ICD-10-CM

## 2020-01-08 DIAGNOSIS — R1319 Other dysphagia: Secondary | ICD-10-CM

## 2020-01-08 LAB — URINALYSIS, ROUTINE W REFLEX MICROSCOPIC
Bacteria, UA: NONE SEEN /HPF
Bilirubin Urine: NEGATIVE
Glucose, UA: NEGATIVE
Hgb urine dipstick: NEGATIVE
Hyaline Cast: NONE SEEN /LPF
Ketones, ur: NEGATIVE
Nitrite: NEGATIVE
Protein, ur: NEGATIVE
Specific Gravity, Urine: 1.025 (ref 1.001–1.03)
pH: 6.5 (ref 5.0–8.0)

## 2020-01-08 LAB — MICROSCOPIC MESSAGE

## 2020-01-08 MED ORDER — OMEPRAZOLE 20 MG PO CPDR: 20.0000 mg | DELAYED_RELEASE_CAPSULE | Freq: Every day | ORAL | 3 refills | Status: DC

## 2020-01-08 MED ORDER — MUPIROCIN 2 % EX OINT: 1.0000 "application " | TOPICAL_OINTMENT | Freq: Two times a day (BID) | CUTANEOUS | 0 refills | Status: DC

## 2020-01-08 NOTE — Patient Instructions (Addendum)
Flu shot  Referral to GI Referral to urology for bladder Start omeprazole for your stomach/throat Use ointment for nose  F/U as needed

## 2020-01-08 NOTE — Progress Notes (Signed)
Subjective:    Patient ID: Stephanie Wall, female    DOB: 12-Jul-1955, 64 y.o.   MRN: [id number]  Patient presents for Throat Issues (feels like she has something stuck in her throat when she swallows), Nasal Bump (bump to L nare inside no tenderness), and Urinary retention (feels like she is having to push to get all the urine out)   Noticed food getting stuck for the past for months, worse over the past month.  She has a cough some after eating.  Sometimes she feels like she may need to force herself to vomit up the food.  She has not noticed her pills getting stuck. Has some reflux symptoms, belches a lot  She did have gastrosenttis   Patient also here with urinary symptoms.  Feels like she has to push hard to get all her urine out.  Some mornings she will have some burning sensation of pressure.  This is been going on for many months.  She does notice a little odor to the urine but is not consistent.  She has not seen any blood in the urine.  She feels like her bowels move fairly well.  She does not have any incontinence episodes.   Bump in he rnose fhe past month, she used a rice bag , she statesinitially lesion was hanging down but then it went down some after she used that heat to the area. She did not have any sinusitis symptoms before the lesion came up.  There is been no nosebleeds.      Review Of Systems:  GEN- denies fatigue, fever, weight loss,weakness, recent illness HEENT- denies eye drainage, change in vision, nasal discharge, CVS- denies chest pain, palpitations RESP- denies SOB, cough, wheeze ABD- denies N/V, change in stools, abd pain GU- denies dysuria, hematuria, dribbling, incontinence MSK- denies joint pain, muscle aches, injury Neuro- denies headache, dizziness, syncope, seizure activity       Objective:    BP 122/60   Pulse 62   Temp 97.8 F (36.6 C) (Temporal)   Resp 14   Ht 5\' 7"  (1.702 m)   Wt 233 lb (105.7 kg)   SpO2 98%   BMI 36.49 kg/m   GEN- NAD, alert and oriented x3 HEENT- PERRL, EOMI, non injected sclera, pink conjunctiva, MMM, oropharynx clear, nares no rhinorrhea or congestion noted left nares small pustule at the top Neck- Supple, no thyromegaly, no lymphadenopathy CVS- RRR, no murmur RESP-CTAB ABD-NABS,soft,NT,ND, no CVA tenderness EXT- No edema Pulses- Radial, DP- 2+        Assessment & Plan:      Problem List Items Addressed This Visit    None    Visit Diagnoses    Urinary retention    -  Primary   Urinalysis is not overwhelming for infection will send for culture.  If this is been going on for some time for urology referral   Relevant Orders   Urinalysis, Routine w reflex microscopic (Completed)   Urine Culture   Ambulatory referral to Urology   Nasal lesion       topical bactroban  to nares for pustular lesion.   Gastroesophageal reflux disease without esophagitis       Relevant Medications   omeprazole (PRILOSEC) 20 MG capsule   Other Relevant Orders   Ambulatory referral to Gastroenterology   Esophageal dysphagia       Dysphagia symptoms with GERD.  Will start omeprazole daily will send to GI for EGD.   Relevant Orders  Ambulatory referral to Gastroenterology      Note: This dictation was prepared with Dragon dictation along with smaller phrase technology. Any transcriptional errors that result from this process are unintentional.

## 2020-01-10 ENCOUNTER — Other Ambulatory Visit: Payer: Self-pay | Admitting: *Deleted

## 2020-01-10 LAB — URINE CULTURE
MICRO NUMBER:: 10953740
SPECIMEN QUALITY:: ADEQUATE

## 2020-01-10 MED ORDER — AMOXICILLIN 500 MG PO CAPS: 500.0000 mg | ORAL_CAPSULE | Freq: Two times a day (BID) | ORAL | 0 refills | Status: AC

## 2020-02-21 DIAGNOSIS — R3912 Poor urinary stream: Secondary | ICD-10-CM | POA: Diagnosis not present

## 2020-02-21 DIAGNOSIS — R350 Frequency of micturition: Secondary | ICD-10-CM | POA: Diagnosis not present

## 2020-02-27 ENCOUNTER — Ambulatory Visit: Payer: Medicare HMO | Admitting: Gastroenterology

## 2020-02-27 ENCOUNTER — Encounter: Payer: Self-pay | Admitting: Gastroenterology

## 2020-02-27 DIAGNOSIS — R1310 Dysphagia, unspecified: Secondary | ICD-10-CM

## 2020-02-27 NOTE — Progress Notes (Signed)
02/27/2020 Stephanie Wall [id number] [birth year]   HISTORY OF PRESENT ILLNESS: This is a pleasant 64 year old female who is a patient of Dr. Woodward Ku.  She is known to her only for colonoscopy which was last performed in 2018.  She is here today with complaints of dysphagia.  She says that has been going on for years, may be increasing in frequency recently.  She reports intermittent issues with swallowing certain foods such as chicken.  She reports issues with swallowing liquids only if she tips her head back too far and tries to drink too quickly.  She says if she eats late at night then she gets heartburn/reflux.  She had not been on any antireflux medications until recently in mid September when she was placed on omeprazole 20 mg daily by her PCP.  At this point she does not think it is really made much of a difference.   Past Medical History:  Diagnosis Date  . Abnormal glandular Papanicolaou smear of cervix   . ALLERGIC RHINITIS   . Allergy   . Arthritis   . Asthma    allergy induced  . Back pain   . Cervicalgia   . Fibroids    uterus  . Obesity, unspecified   . Snores   . Wears glasses    Past Surgical History:  Procedure Laterality Date  . BREAST CYST EXCISION  1984 and 1997  . BREAST REDUCTION SURGERY Bilateral 07/16/2012   Procedure: BILATERAL BREAST REDUCTION  (BREAST);  Surgeon: Charlene Brooke, MD;  Location: [place];  Service: Plastics;  Laterality: Bilateral;  . CHOLECYSTECTOMY  1976  . COLONOSCOPY    . REDUCTION MAMMAPLASTY      reports that she has never smoked. She has never used smokeless tobacco. She reports that she does not drink alcohol and does not use drugs. family history includes Allergic rhinitis in her mother; Arthritis in her maternal grandmother and paternal grandmother; Birth defects in her mother; Cancer (age of onset: 66) in her maternal grandmother; Diabetes in her maternal grandmother; HIV in her sister; Heart disease  in her maternal grandfather, maternal grandmother, and paternal aunt; Hypertension in her father, maternal grandmother, mother, and paternal grandmother; Kidney disease in her paternal grandmother; Stroke in her paternal grandfather; Thyroid disease in her maternal grandmother. Allergies  Allergen Reactions  . Demerol Hives  . Dilaudid [Hydromorphone Hcl] Swelling    Could not swallow  . Morphine And Related Hives      Outpatient Encounter Medications as of 02/27/2020  Medication Sig  . albuterol (PROAIR HFA) 108 (90 Base) MCG/ACT inhaler Inhale 4 puffs into the lungs every 4 (four) hours as needed for wheezing or shortness of breath.  [name] CALCIUM PO Take 1,200 mg by mouth daily.  . cephALEXin (KEFLEX) 500 MG capsule Take 500 mg by mouth 3 (three) times daily.  . cholecalciferol (VITAMIN D) 1000 units tablet Take 1,000 Units by mouth daily.  . fluticasone (FLOVENT HFA) 110 MCG/ACT inhaler Inhale 2 puffs into the lungs 2 (two) times daily.  . montelukast (SINGULAIR) 10 MG tablet TAKE 1 TABLET(10 MG) BY MOUTH AT BEDTIME  . mupirocin ointment (BACTROBAN) 2 % Apply 1 application topically 2 (two) times daily. For 1 week to pustule in nose  . omeprazole (PRILOSEC) 20 MG capsule Take 1 capsule (20 mg total) by mouth daily.  . tamsulosin (FLOMAX) 0.4 MG CAPS capsule Take 1 capsule by mouth daily.   Facility-Administered Encounter Medications as of 02/27/2020  Medication  .  0.9 %  sodium chloride infusion     REVIEW OF SYSTEMS  : All other systems reviewed and negative except where noted in the History of Present Illness.   PHYSICAL EXAM: BP 122/78 (BP Location: Left Arm, Patient Position: Sitting, Cuff Size: Normal)   Pulse 72   Ht 6' (1.829 m) Comment: height measured without shoes  Wt 233 lb (105.7 kg)   BMI 31.60 kg/m  General: Well developed AA female in no acute distress Head: Normocephalic and atraumatic Eyes:  Sclerae anicteric, conjunctiva pink. Ears: Normal auditory  acuity Lungs: Clear throughout to auscultation; no W/R/R. Heart: Regular rate and rhythm; no M/R/G. Abdomen: Soft, non-distended.  BS present.  Mild suprapubic TTP (being treated for UTI). Musculoskeletal: Symmetrical with no gross deformities  Skin: No lesions on visible extremities Extremities: No edema  Neurological: Alert oriented x 4, grossly non-focal Psychological:  Alert and cooperative. Normal mood and affect  ASSESSMENT AND PLAN: *Dysphagia and GERD:  Dysphagia ntermittent for years.  Mostly occurring with certain foods, but also has trouble with liquids if she tips her head back too far and drinks too quickly.  Question esophageal stricture/structural issue versus dysmotility.  We will plan for EGD with possible dilation with Dr. Silverio Decamp.  The risks, benefits, and alternatives to EGD with possible dilation were discussed with the patient and she consents to proceed.  Continue omeprazole 20 mg daily for now.  CC:  [place], Modena Nunnery, MD

## 2020-02-27 NOTE — Patient Instructions (Addendum)
If you are age 66 or older, your body mass index should be between 23-30. Your Body mass index is 31.6 kg/m. If this is out of the aforementioned range listed, please consider follow up with your Primary Care Provider.  If you are age 61 or younger, your body mass index should be between 19-25. Your Body mass index is 31.6 kg/m. If this is out of the aformentioned range listed, please consider follow up with your Primary Care Provider.   You have been scheduled for an endoscopy. Please follow written instructions given to you at your visit today. If you use inhalers (even only as needed), please bring them with you on the day of your procedure.  Due to recent changes in healthcare laws, you may see the results of your imaging and laboratory studies on MyChart before your provider has had a chance to review them.  We understand that in some cases there may be results that are confusing or concerning to you. Not all laboratory results come back in the same time frame and the provider may be waiting for multiple results in order to interpret others.  Please give [place] 48 hours in order for your provider to thoroughly review all the results before contacting the office for clarification of your results.   Thank you for entrusting me with your care and choosing [facility].  Alonza Bogus, PA-C

## 2020-02-28 ENCOUNTER — Ambulatory Visit (AMBULATORY_SURGERY_CENTER): Payer: Medicare HMO | Admitting: Gastroenterology

## 2020-02-28 ENCOUNTER — Encounter: Payer: Self-pay | Admitting: Gastroenterology

## 2020-02-28 ENCOUNTER — Other Ambulatory Visit: Payer: Self-pay

## 2020-02-28 VITALS — BP 113/68 | HR 61 | Temp 96.9°F | Resp 16 | Ht 72.0 in | Wt 233.0 lb

## 2020-02-28 DIAGNOSIS — R1310 Dysphagia, unspecified: Secondary | ICD-10-CM

## 2020-02-28 DIAGNOSIS — B9681 Helicobacter pylori [H. pylori] as the cause of diseases classified elsewhere: Secondary | ICD-10-CM | POA: Diagnosis not present

## 2020-02-28 DIAGNOSIS — K2980 Duodenitis without bleeding: Secondary | ICD-10-CM

## 2020-02-28 DIAGNOSIS — K222 Esophageal obstruction: Secondary | ICD-10-CM

## 2020-02-28 DIAGNOSIS — K2970 Gastritis, unspecified, without bleeding: Secondary | ICD-10-CM

## 2020-02-28 DIAGNOSIS — K449 Diaphragmatic hernia without obstruction or gangrene: Secondary | ICD-10-CM | POA: Diagnosis not present

## 2020-02-28 DIAGNOSIS — K219 Gastro-esophageal reflux disease without esophagitis: Secondary | ICD-10-CM | POA: Diagnosis not present

## 2020-02-28 DIAGNOSIS — K2950 Unspecified chronic gastritis without bleeding: Secondary | ICD-10-CM | POA: Diagnosis not present

## 2020-02-28 MED ORDER — SODIUM CHLORIDE 0.9 % IV SOLN: 500.0000 mL | Freq: Once | INTRAVENOUS | Status: DC

## 2020-02-28 MED ORDER — OMEPRAZOLE 40 MG PO CPDR: 40.0000 mg | DELAYED_RELEASE_CAPSULE | Freq: Every day | ORAL | 3 refills | Status: DC

## 2020-02-28 NOTE — Progress Notes (Signed)
To PACU, VSS. Report to [email or web address] 

## 2020-02-28 NOTE — Progress Notes (Signed)
VS by [place]. 

## 2020-02-28 NOTE — Op Note (Signed)
Stephanie Wall Patient Name: Stephanie Wall Procedure Date: 02/28/2020 10:08 AM MRN: [id number] Endoscopist: Mauri Pole , MD Age: 64 Referring MD:  Date of Birth: 11/15/1955 Gender: Female Account #: [id number] Procedure:                Upper GI endoscopy Indications:              Dysphagia Medicines:                Monitored Anesthesia Care Procedure:                Pre-Anesthesia Assessment:                           - Prior to the procedure, a History and Physical                            was performed, and patient medications and                            allergies were reviewed. The patient's tolerance of                            previous anesthesia was also reviewed. The risks                            and benefits of the procedure and the sedation                            options and risks were discussed with the patient.                            All questions were answered, and informed consent                            was obtained. Prior Anticoagulants: The patient has                            taken no previous anticoagulant or antiplatelet                            agents. ASA Grade Assessment: II - A patient with                            mild systemic disease. After reviewing the risks                            and benefits, the patient was deemed in                            satisfactory condition to undergo the procedure.                           After obtaining informed consent, the endoscope was  passed under direct vision. Throughout the                            procedure, the patient's blood pressure, pulse, and                            oxygen saturations were monitored continuously. The                            Endoscope was introduced through the mouth, and                            advanced to the second part of duodenum. The upper                            GI endoscopy was accomplished without  difficulty.                            The patient tolerated the procedure well. Scope In: Scope Out: Findings:                 The Z-line was regular and was found 35 cm from the                            incisors.                           One benign-appearing, intrinsic mild                            (non-circumferential scarring) stenosis was found                            34 to 35 cm from the incisors. This stenosis                            measured 1.8 cm (inner diameter) x less than one cm                            (in length). The stenosis was traversed. A TTS                            dilator was passed through the scope. Dilation with                            an 18-19-20 mm balloon dilator was performed to 20                            mm. The dilation site was examined following                            endoscope reinsertion and showed no change.  The gastroesophageal flap valve was visualized                            endoscopically and classified as Hill Grade IV (no                            fold, wide open lumen, hiatal hernia present).                           A medium-sized hiatal hernia was present.                           Patchy mild inflammation characterized by                            congestion (edema), erythema and mucus was found in                            the entire examined stomach. Biopsies were taken                            with a cold forceps for Helicobacter pylori testing.                           Diffuse mucosal flattening was found in the first                            portion of the duodenum and in the second portion                            of the duodenum. Biopsies for histology were taken                            with a cold forceps for evaluation of celiac                            disease. Complications:            No immediate complications. Estimated Blood Loss:     Estimated blood loss  was minimal. Impression:               - Z-line regular, 35 cm from the incisors.                           - Benign-appearing esophageal stenosis. Dilated.                           - Gastroesophageal flap valve classified as Hill                            Grade IV (no fold, wide open lumen, hiatal hernia                            present).                           -  Medium-sized hiatal hernia.                           - Gastritis. Biopsied.                           - Flattened mucosa was found in the duodenum, rule                            out celiac disease. Biopsied. Recommendation:           - Patient has a contact number available for                            emergencies. The signs and symptoms of potential                            delayed complications were discussed with the                            patient. Return to normal activities tomorrow.                            Written discharge instructions were provided to the                            patient.                           - Resume previous diet.                           - Continue present medications.                           - Await pathology results.                           - Return to GI office in 2-3 months, next available                            appointment. Please call to schedule appointment.                           - Use Prilosec (omeprazole) 40 mg PO daily for 6                            months.                           - Follow an antireflux regimen indefinitely. Mauri Pole, MD 02/28/2020 10:55:28 AM This report has been signed electronically.

## 2020-02-28 NOTE — Progress Notes (Signed)
Called to room to assist during endoscopic procedure.  Patient ID and intended procedure confirmed with present staff. Received instructions for my participation in the procedure from the performing physician.  

## 2020-02-28 NOTE — Patient Instructions (Signed)
Discharge instructions given. Handouts on Haital Hernia and a Dilatation Diet. Resume previous medications. Resume previous medications. Prescription sent to pharmacy. YOU HAD AN ENDOSCOPIC PROCEDURE TODAY AT [facility]:   Refer to the procedure report that was given to you for any specific questions about what was found during the examination.  If the procedure report does not answer your questions, please call your gastroenterologist to clarify.  If you requested that your care partner not be given the details of your procedure findings, then the procedure report has been included in a sealed envelope for you to review at your convenience later.  YOU SHOULD EXPECT: Some feelings of bloating in the abdomen. Passage of more gas than usual.  Walking can help get rid of the air that was put into your GI tract during the procedure and reduce the bloating. If you had a lower endoscopy (such as a colonoscopy or flexible sigmoidoscopy) you may notice spotting of blood in your stool or on the toilet paper. If you underwent a bowel prep for your procedure, you may not have a normal bowel movement for a few days.  Please Note:  You might notice some irritation and congestion in your nose or some drainage.  This is from the oxygen used during your procedure.  There is no need for concern and it should clear up in a day or so.  SYMPTOMS TO REPORT IMMEDIATELY:    Following upper endoscopy (EGD)  Vomiting of blood or coffee ground material  New chest pain or pain under the shoulder blades  Painful or persistently difficult swallowing  New shortness of breath  Fever of 100F or higher  Black, tarry-looking stools  For urgent or emergent issues, a gastroenterologist can be reached at any hour by calling [phone]. Do not use MyChart messaging for urgent concerns.    DIET:  We do recommend a small meal at first, but then you may proceed to your regular diet.  Drink plenty of fluids  but you should avoid alcoholic beverages for 24 hours.  ACTIVITY:  You should plan to take it easy for the rest of today and you should NOT DRIVE or use heavy machinery until tomorrow (because of the sedation medicines used during the test).    FOLLOW UP: Our staff will call the number listed on your records 48-72 hours following your procedure to check on you and address any questions or concerns that you may have regarding the information given to you following your procedure. If we do not reach you, we will leave a message.  We will attempt to reach you two times.  During this call, we will ask if you have developed any symptoms of COVID 19. If you develop any symptoms (ie: fever, flu-like symptoms, shortness of breath, cough etc.) before then, please call [phone].  If you test positive for Covid 19 in the 2 weeks post procedure, please call and report this information to [place].    If any biopsies were taken you will be contacted by phone or by letter within the next 1-3 weeks.  Please call us at [phone] if you have not heard about the biopsies in 3 weeks.    SIGNATURES/CONFIDENTIALITY: You and/or your care partner have signed paperwork which will be entered into your electronic medical record.  These signatures attest to the fact that that the information above on your After Visit Summary has been reviewed and is understood.  Full responsibility of the confidentiality of this  discharge information lies with you and/or your care-partner.

## 2020-03-03 ENCOUNTER — Telehealth: Payer: Self-pay

## 2020-03-03 NOTE — Telephone Encounter (Signed)
  Follow up Call-  Call back number 02/28/2020  Post procedure Call Back phone  # [phone]  Permission to leave phone message Yes  Some recent data might be hidden     Patient questions:  Do you have a fever, pain , or abdominal swelling? No. Pain Score  0 *  Have you tolerated food without any problems? Yes.    Have you been able to return to your normal activities? Yes.    Do you have any questions about your discharge instructions: Diet   No. Medications  No. Follow up visit  No.  Do you have questions or concerns about your Care? No.   Pt did report that the 2 upper sites where the heart monitor leads were placed were red and tender for a day or so.  Not sure if it was because they were pulled off too quickly or if a reaction from her skin.  States it is progressively getting better.  Actions: * If pain score is 4 or above: No action needed, pain <4.  1. Have you developed a fever since your procedure? no  2.   Have you had an respiratory symptoms (SOB or cough) since your procedure? no  3.   Have you tested positive for COVID 19 since your procedure no  4.   Have you had any family members/close contacts diagnosed with the COVID 19 since your procedure?  no   If yes to any of these questions please route to Joylene John, RN and Joella Prince, RN

## 2020-03-09 ENCOUNTER — Telehealth: Payer: Self-pay | Admitting: Gastroenterology

## 2020-03-24 NOTE — Progress Notes (Signed)
Reviewed and agree with documentation and assessment and plan. K. Veena Georgianna Band , MD   

## 2020-03-26 ENCOUNTER — Other Ambulatory Visit: Payer: Self-pay

## 2020-03-26 MED ORDER — TALICIA 250-12.5-10 MG PO CPDR: 4.0000 | DELAYED_RELEASE_CAPSULE | Freq: Three times a day (TID) | ORAL | 0 refills | Status: AC

## 2020-03-27 ENCOUNTER — Telehealth: Payer: Self-pay | Admitting: *Deleted

## 2020-03-27 NOTE — Telephone Encounter (Signed)
We have received a fax from [facility] indicating that patient's Stephanie Wall has ben approved effective 03/27/20-04/24/21 with a copay of $95.00. They are reaching out to patient to advise of this.

## 2020-03-30 NOTE — Telephone Encounter (Signed)
Pt has questions about this medication, pls call her.

## 2020-03-30 NOTE — Telephone Encounter (Signed)
Spoke with patient, she was wanting to know her cost, she said the [place] just called her and told her and the medication will be there Wednesday she will take it for 14 days

## 2020-03-31 ENCOUNTER — Encounter: Payer: Medicare HMO | Admitting: Gastroenterology

## 2020-04-09 NOTE — Telephone Encounter (Signed)
Pt is requesting a call back from a nurse in regards to her [place] medication, pt did not disclose further information.

## 2020-04-09 NOTE — Telephone Encounter (Signed)
Spoke with patient , she said the pharmacy told her to take [place] for 14 days but Eustaquio Maize had told her 7 days. I told her to take it as prescribed which is the 14 days   No further questions from the patient

## 2020-04-14 ENCOUNTER — Other Ambulatory Visit: Payer: Self-pay

## 2020-04-14 DIAGNOSIS — A048 Other specified bacterial intestinal infections: Secondary | ICD-10-CM

## 2020-04-14 NOTE — Telephone Encounter (Signed)
Pt is requesting a call back from Beth to advise her what she needs to do after finishing her [place]

## 2020-04-14 NOTE — Telephone Encounter (Signed)
Patient will finish her antibiotics in 2 days. Dates given for when to stop the Omeprazole and any other stomach medications except TUMS. Date given for collecting the stool specimen for follow up testing.

## 2020-04-14 NOTE — Telephone Encounter (Signed)
No more information needed  

## 2020-05-18 ENCOUNTER — Other Ambulatory Visit: Payer: Medicare HMO

## 2020-05-29 ENCOUNTER — Other Ambulatory Visit: Payer: Medicare HMO

## 2020-05-29 DIAGNOSIS — A048 Other specified bacterial intestinal infections: Secondary | ICD-10-CM

## 2020-05-31 LAB — H. PYLORI ANTIGEN, STOOL: H pylori Ag, Stl: NEGATIVE

## 2020-06-30 ENCOUNTER — Ambulatory Visit: Payer: Self-pay | Admitting: Family Medicine

## 2020-07-01 ENCOUNTER — Other Ambulatory Visit: Payer: Self-pay

## 2020-07-01 ENCOUNTER — Encounter: Payer: Self-pay | Admitting: Family Medicine

## 2020-07-01 ENCOUNTER — Ambulatory Visit (INDEPENDENT_AMBULATORY_CARE_PROVIDER_SITE_OTHER): Payer: Medicare HMO | Admitting: Family Medicine

## 2020-07-01 VITALS — BP 116/78 | HR 95 | Temp 98.0°F | Ht 72.0 in | Wt 238.8 lb

## 2020-07-01 DIAGNOSIS — E559 Vitamin D deficiency, unspecified: Secondary | ICD-10-CM | POA: Diagnosis not present

## 2020-07-01 DIAGNOSIS — K5909 Other constipation: Secondary | ICD-10-CM

## 2020-07-01 DIAGNOSIS — E669 Obesity, unspecified: Secondary | ICD-10-CM | POA: Diagnosis not present

## 2020-07-01 DIAGNOSIS — J4520 Mild intermittent asthma, uncomplicated: Secondary | ICD-10-CM | POA: Diagnosis not present

## 2020-07-01 DIAGNOSIS — M8580 Other specified disorders of bone density and structure, unspecified site: Secondary | ICD-10-CM | POA: Diagnosis not present

## 2020-07-01 DIAGNOSIS — Z0000 Encounter for general adult medical examination without abnormal findings: Secondary | ICD-10-CM

## 2020-07-01 DIAGNOSIS — Z1231 Encounter for screening mammogram for malignant neoplasm of breast: Secondary | ICD-10-CM

## 2020-07-01 DIAGNOSIS — Z0001 Encounter for general adult medical examination with abnormal findings: Secondary | ICD-10-CM | POA: Diagnosis not present

## 2020-07-01 MED ORDER — EPINEPHRINE 0.3 MG/0.3ML IJ SOAJ: 0.3000 mg | INTRAMUSCULAR | 2 refills | Status: DC | PRN

## 2020-07-01 MED ORDER — LUBIPROSTONE 8 MCG PO CAPS: 8.0000 ug | ORAL_CAPSULE | Freq: Two times a day (BID) | ORAL | 3 refills | Status: DC

## 2020-07-01 MED ORDER — MONTELUKAST SODIUM 10 MG PO TABS: ORAL_TABLET | ORAL | 3 refills | Status: DC

## 2020-07-01 MED ORDER — FLOVENT HFA 110 MCG/ACT IN AERO: 2.0000 | INHALATION_SPRAY | Freq: Two times a day (BID) | RESPIRATORY_TRACT | 11 refills | Status: DC

## 2020-07-01 MED ORDER — ALBUTEROL SULFATE HFA 108 (90 BASE) MCG/ACT IN AERS: 4.0000 | INHALATION_SPRAY | RESPIRATORY_TRACT | 2 refills | Status: AC | PRN

## 2020-07-01 NOTE — Progress Notes (Signed)
Subjective:   Patient presents for Medicare Annual/Subsequent preventive examination.    Pt here for wellness visit  medications reviewed  Asthma- taking flovent , will restart singulair due to allergy season coming up   Chronic constipation wants to restart amitiza, needs new script has seen GI      Review Past Medical/Family/Social: per EMR   Risk Factors  Current exercise habits: none  Dietary issues discussed: Yes   Cardiac risk factors: Obesity (BMI >= 30 kg/m2).   Depression Screen  (Note: if answer to either of the following is "Yes", a more complete depression screening is indicated)  Over the past two weeks, have you felt down, depressed or hopeless? No Over the past two weeks, have you felt little interest or pleasure in doing things? No Have you lost interest or pleasure in daily life? No Do you often feel hopeless? No Do you cry easily over simple problems? No   Activities of Daily Living  In your present state of health, do you have any difficulty performing the following activities?:  Driving? No  Managing money? No  Feeding yourself? No  Getting from bed to chair? No  Climbing a flight of stairs? No  Preparing food and eating?: No  Bathing or showering? No  Getting dressed: No  Getting to the toilet? No  Using the toilet:No  Moving around from place to place: No  In the past year have you fallen or had a near fall?:No     Hearing Difficulties: No  Do you often ask people to speak up or repeat themselves? No  Do you experience ringing or noises in your ears? No Do you have difficulty understanding soft or whispered voices? No  Do you feel that you have a problem with memory? No Do you often misplace items? No  Do you feel safe at home? Yes  Cognitive Testing  Alert? Yes Normal Appearance?Yes  Oriented to person? Yes Place? Yes  Time? Yes  Recall of three objects? Yes  Can perform simple calculations? Yes  Displays appropriate judgment?Yes   Can read the correct time from a watch face?Yes   List the Names of Other Physician/Practitioners you currently use:  Eye doctor  GI- Dr. Silverio Decamp  Screening Tests / Date Colonoscopy UTD                     Zostavax  Mammogram Due in April Influenza Vaccine - UTD  Tetanus/tdap UTD  Bone Density- UTD  COVID-19 UTD   ROS:  GEN- denies fatigue, fever, weight loss,weakness, recent illness HEENT- denies eye drainage, change in vision, nasal discharge, CVS- denies chest pain, palpitations RESP- denies SOB, cough, wheeze ABD- denies N/V, change in stools, abd pain GU- denies dysuria, hematuria, dribbling, incontinence MSK- denies joint pain, muscle aches, injury Neuro- denies headache, dizziness, syncope, seizure activity  PHYSICAL:  GEN- NAD, alert and oriented x3 HEENT- PERRL, EOMI, non injected sclera, pink conjunctiva, MMM, oropharynx clear,tm CLEAR NO EFFUSION Neck- Supple, no thryomegaly, no carotid bruit  CVS- RRR, no murmur RESP-CTAB ABD-NABS,soft,NT,ND  EXT- No edema Pulses- Radial, DP- 2+   Assessment:    Annual wellness medicare exam   Plan:    During the course of the visit the patient was educated and counseled about appropriate screening and preventive services including:    CPE done, pt has advanced directives FULL CODE   Immunizations declines PNA vaccine today   ALlergies- singulair/ allegra prn, flonase   Asthma- continue flovent, albuterol prn  Osteopenia- discussed importance of vitamin D and calcium supplement, weight bearing exercise  Obesity- reduce carbs, fasting labs drawn and lipids  Breast cancer screen, pt to schedule for April  Chronic Constipation- Amitiza refilled   FALL/AUDIT C/Depression screen negative            Patient Instructions (the written plan) was given to the patient.  Medicare Attestation  I have personally reviewed:  The patient's medical and social history  Their use of alcohol, tobacco or illicit  drugs  Their current medications and supplements  The patient's functional ability including ADLs,fall risks, home safety risks, cognitive, and hearing and visual impairment  Diet and physical activities  Evidence for depression or mood disorders  The patient's weight, height, BMI, and visual acuity have been recorded in the chart. I have made referrals, counseling, and provided education to the patient based on review of the above and I have provided the patient with a written personalized care plan for preventive services.

## 2020-07-01 NOTE — Patient Instructions (Addendum)
F/U as needed  Schedule your mammogram

## 2020-07-02 LAB — LIPID PANEL
Cholesterol: 171 mg/dL (ref <200)
HDL: 64 mg/dL (ref >=50)
LDL Cholesterol (Calc): 90 mg/dL (calc)
Non-HDL Cholesterol (Calc): 107 mg/dL (calc) (ref <130)
Total CHOL/HDL Ratio: 2.7 (calc) (ref <5.0)
Triglycerides: 77 mg/dL (ref <150)

## 2020-07-02 LAB — CBC WITH DIFFERENTIAL/PLATELET
Absolute Monocytes: 405 cells/uL (ref 200–950)
Basophils Absolute: 20 cells/uL (ref 0–200)
Basophils Relative: 0.4 %
Eosinophils Absolute: 150 cells/uL (ref 15–500)
Eosinophils Relative: 3 %
HCT: 39.5 % (ref 35.0–45.0)
Hemoglobin: 12.9 g/dL (ref 11.7–15.5)
Lymphs Abs: 1975 cells/uL (ref 850–3900)
MCH: 29.8 pg (ref 27.0–33.0)
MCHC: 32.7 g/dL (ref 32.0–36.0)
MCV: 91.2 fL (ref 80.0–100.0)
MPV: 10.8 fL (ref 7.5–12.5)
Monocytes Relative: 8.1 %
Neutro Abs: 2450 cells/uL (ref 1500–7800)
Neutrophils Relative %: 49 %
Platelets: 242 10*3/uL (ref 140–400)
RBC: 4.33 10*6/uL (ref 3.80–5.10)
RDW: 14.6 % (ref 11.0–15.0)
Total Lymphocyte: 39.5 %
WBC: 5 10*3/uL (ref 3.8–10.8)

## 2020-07-02 LAB — COMPREHENSIVE METABOLIC PANEL
AG Ratio: 1.5 (calc) (ref 1.0–2.5)
ALT: 16 U/L (ref 6–29)
AST: 19 U/L (ref 10–35)
Albumin: 4.3 g/dL (ref 3.6–5.1)
Alkaline phosphatase (APISO): 64 U/L (ref 37–153)
BUN: 12 mg/dL (ref 7–25)
CO2: 25 mmol/L (ref 20–32)
Calcium: 9.6 mg/dL (ref 8.6–10.4)
Chloride: 106 mmol/L (ref 98–110)
Creat: 0.94 mg/dL (ref 0.50–0.99)
Globulin: 2.8 g/dL (calc) (ref 1.9–3.7)
Glucose, Bld: 88 mg/dL (ref 65–99)
Potassium: 4.3 mmol/L (ref 3.5–5.3)
Sodium: 142 mmol/L (ref 135–146)
Total Bilirubin: 0.4 mg/dL (ref 0.2–1.2)
Total Protein: 7.1 g/dL (ref 6.1–8.1)

## 2020-07-02 LAB — VITAMIN D 25 HYDROXY (VIT D DEFICIENCY, FRACTURES): Vit D, 25-Hydroxy: 28 ng/mL — ABNORMAL LOW (ref 30–100)

## 2020-07-06 ENCOUNTER — Encounter: Payer: Self-pay | Admitting: *Deleted

## 2020-08-03 ENCOUNTER — Telehealth: Payer: Self-pay | Admitting: Family Medicine

## 2020-08-03 NOTE — Progress Notes (Signed)
  Chronic Care Management   Note  08/03/2020 Name: Stephanie Wall MRN: [id number] DOB: 09-29-1955  Stephanie Wall is a 65 y.o. year old female who is a primary care patient of [place], Modena Nunnery, MD. I reached out to [employer] by phone today in response to a referral sent by Stephanie Wall PCP, Buelah Manis, Modena Nunnery, MD.   Stephanie Wall was given information about Chronic Care Management services today including:  1. CCM service includes personalized support from designated clinical staff supervised by her physician, including individualized plan of care and coordination with other care providers 2. 24/7 contact phone numbers for assistance for urgent and routine care needs. 3. Service will only be billed when office clinical staff spend 20 minutes or more in a month to coordinate care. 4. Only one practitioner may furnish and bill the service in a calendar month. 5. The patient may stop CCM services at any time (effective at the end of the month) by phone call to the office staff.   Patient agreed to services and verbal consent obtained.   Follow up plan:   Carley Perdue [employer] Scheduler

## 2020-08-03 NOTE — Progress Notes (Deleted)
Chronic Care Management Pharmacy Note  08/03/2020 Name:  Stephanie Wall MRN:  [id number] DOB:  1955-05-01  Subjective: Stephanie Wall is an 65 y.o. year old female who is a primary patient of [place], Modena Nunnery, MD.  The CCM team was consulted for assistance with disease management and care coordination needs.    Engaged with patient face to face for initial visit in response to provider referral for pharmacy case management and/or care coordination services.   Consent to Services:  The patient was given the following information about Chronic Care Management services today, agreed to services, and gave verbal consent: 1. CCM service includes personalized support from designated clinical staff supervised by the primary care provider, including individualized plan of care and coordination with other care providers 2. 24/7 contact phone numbers for assistance for urgent and routine care needs. 3. Service will only be billed when office clinical staff spend 20 minutes or more in a month to coordinate care. 4. Only one practitioner may furnish and bill the service in a calendar month. 5.The patient may stop CCM services at any time (effective at the end of the month) by phone call to the office staff. 6. The patient will be responsible for cost sharing (co-pay) of up to 20% of the service fee (after annual deductible is met). Patient agreed to services and consent obtained.  Patient Care Team: Alycia Rossetti, MD as PCP - General (Family Medicine) Edythe Clarity, [facility] as Pharmacist (Pharmacist)  Recent office visits: 07/01/20 [facility]) - regular wellness visit, no medication changes refular follow up  Recent consult visits: 02/27/2020 ([facility], gastro) - no med changes, continue PPI and order EGD for possible esophageal stricture  Hospital visits: None in previous 6 months  Objective:  Lab Results  Component Value Date   CREATININE 0.94 07/01/2020   BUN 12 07/01/2020   GFRNONAA 61  06/17/2019   GFRAA 71 06/17/2019   NA 142 07/01/2020   K 4.3 07/01/2020   CALCIUM 9.6 07/01/2020   CO2 25 07/01/2020   GLUCOSE 88 07/01/2020    No results found for: HGBA1C, FRUCTOSAMINE, GFR, MICROALBUR  Last diabetic Eye exam: No results found for: HMDIABEYEEXA  Last diabetic Foot exam: No results found for: HMDIABFOOTEX   Lab Results  Component Value Date   CHOL 171 07/01/2020   HDL 64 07/01/2020   LDLCALC 90 07/01/2020   TRIG 77 07/01/2020   CHOLHDL 2.7 07/01/2020    Hepatic Function Latest Ref Rng & Units 07/01/2020 06/17/2019 07/04/2018  Total Protein 6.1 - 8.1 g/dL 7.1 6.6 7.0  Albumin 3.6 - 5.1 g/dL - - -  AST 10 - 35 U/L _0 ALT 6 - 29 U/L _1 Alk Phosphatase 33 - 130 U/L - - -  Total Bilirubin 0.2 - 1.2 mg/dL 0.4 0.6 0.4    Lab Results  Component Value Date/Time   TSH 1.35 06/17/2019 08:24 AM   TSH 2.46 07/04/2018 09:11 AM    CBC Latest Ref Rng & Units 07/01/2020 06/17/2019 07/04/2018  WBC 3.8 - 10.8 Thousand/uL 5.0 4.5 5.0  Hemoglobin 11.7 - 15.5 g/dL 12.9 12.1 12.0  Hematocrit 35.0 - 45.0 % 39.5 37.3 37.2  Platelets 140 - 400 Thousand/uL 242 206 253    Lab Results  Component Value Date/Time   VD25OH 28 (L) 07/01/2020 08:23 AM   VD25OH 35 06/17/2019 08:24 AM    Clinical ASCVD: {YES/NO:21197} The 10-year ASCVD risk score Mikey Bussing DC Jr., et al., 2013) is:  5.8%   Values used to calculate the score:     Age: 25 years     Sex: Female     Is Non-Hispanic African American: Yes     Diabetic: No     Tobacco smoker: No     Systolic Blood Pressure: 845 mmHg     Is BP treated: No     HDL Cholesterol: 64 mg/dL     Total Cholesterol: 171 mg/dL    Depression screen [facility] 2/9 07/01/2020 06/24/2019 07/04/2017  Decreased Interest 1 0 0  Down, Depressed, Hopeless 0 0 0  PHQ - 2 Score 1 0 0  Altered sleeping 1 - 0  Tired, decreased energy 0 - 0  Change in appetite 0 - 0  Feeling bad or failure about yourself  0 - 0  Trouble concentrating 0 - 0  Moving slowly  or fidgety/restless 0 - 0  Suicidal thoughts 0 - 0  PHQ-9 Score 2 - 0  Difficult doing work/chores Not difficult at all - Not difficult at all     ***Other: (CHADS2VASc if Afib, MMRC or CAT for COPD, ACT, DEXA)  Social History   Tobacco Use  Smoking Status Never Smoker  Smokeless Tobacco Never Used   BP Readings from Last 3 Encounters:  07/01/20 116/78  02/28/20 113/68  02/27/20 122/78   Pulse Readings from Last 3 Encounters:  07/01/20 95  02/28/20 61  02/27/20 72   Wt Readings from Last 3 Encounters:  07/01/20 238 lb 12.8 oz (108.3 kg)  02/28/20 233 lb (105.7 kg)  02/27/20 233 lb (105.7 kg)   BMI Readings from Last 3 Encounters:  07/01/20 32.39 kg/m  02/28/20 31.60 kg/m  02/27/20 31.60 kg/m    Assessment/Interventions: Review of patient past medical history, allergies, medications, health status, including review of consultants reports, laboratory and other test data, was performed as part of comprehensive evaluation and provision of chronic care management services.   SDOH:  (Social Determinants of Health) assessments and interventions performed: {yes/no:20286}  SDOH Screenings   Alcohol Screen: Low Risk   . Last Alcohol Screening Score (AUDIT): 0  Depression (PHQ2-9): Low Risk   . PHQ-2 Score: 2  Financial Resource Strain: Not on file  Food Insecurity: Not on file  Housing: Not on file  Physical Activity: Not on file  Social Connections: Not on file  Stress: Not on file  Tobacco Use: Low Risk   . Smoking Tobacco Use: Never Smoker  . Smokeless Tobacco Use: Never Used  Transportation Needs: Not on file    CCM Care Plan  Allergies  Allergen Reactions  . Demerol Hives  . Dilaudid [Hydromorphone Hcl] Swelling    Could not swallow  . Morphine And Related Hives    Medications Reviewed Today    Reviewed by Alycia Rossetti, MD (Physician) on 07/01/20 at [phone]  Med List Status: <None>  Medication Order Taking? Sig Documenting Provider Last Dose Status  Informant  0.9 %  sodium chloride infusion [id number]   Nandigam, Kavitha V, MD  Active   albuterol (PROAIR HFA) 108 (90 Base) MCG/ACT inhaler [id number]  Inhale 4 puffs into the lungs every 4 (four) hours as needed for wheezing or shortness of breath. Alycia Rossetti, MD  Active   CALCIUM PO [id number] Yes Take 1,200 mg by mouth daily. [provider] Taking Active   cholecalciferol (VITAMIN D) 1000 units tablet [id number] Yes Take 1,000 Units by mouth daily. [provider] Taking Active   EPINEPHrine 0.3 mg/0.3 mL  IJ SOAJ injection [id number] Yes Inject 0.3 mg into the muscle as needed for anaphylaxis. [place], Modena Nunnery, MD  Active   fluticasone [facility]) 110 MCG/ACT inhaler [id number]  Inhale 2 puffs into the lungs 2 (two) times daily. [place], Modena Nunnery, MD  Active   lubiprostone [facility]) 8 MCG capsule [id number] Yes Take 1 capsule (8 mcg total) by mouth 2 (two) times daily with a meal. Buelah Manis, Modena Nunnery, MD  Active   montelukast (SINGULAIR) 10 MG tablet [id number]  TAKE 1 TABLET(10 MG) BY MOUTH AT BEDTIME [place], Modena Nunnery, MD  Active   omeprazole (PRILOSEC) 40 MG capsule [id number] Yes Take 1 capsule (40 mg total) by mouth daily. Mauri Pole, MD Taking Active           Patient Active Problem List   Diagnosis Date Noted  . Osteopenia 09/30/2019  . Vitamin D deficiency 06/10/2015  . Constipation, chronic 05/26/2015  . Allergy-induced asthma 05/30/2014  . Routine general medical examination at a health care facility 07/01/2013  . Obesity (BMI 30-39.9) 07/01/2013  . Allergic rhinitis 01/25/2009  . FIBROIDS, UTERUS 01/21/2009  . PAP SMEAR, ABNORMAL 01/21/2009    Immunization History  Administered Date(s) Administered  . Fluad Quad(high Dose 65+) 01/08/2020  . Hepatitis B 02/03/1992, 03/02/1992, 08/03/1992  . Influenza Whole 01/23/2009  . Influenza,inj,Quad PF,6+ Mos 02/05/2015, 03/15/2018, 02/02/2019  . Influenza-Unspecified 03/29/1994, 02/19/1997,  02/23/2014  . PFIZER(Purple Top)SARS-COV-2 Vaccination 06/16/2019, 07/10/2019  . Pneumococcal Polysaccharide-23 02/19/1997  . Td 06/21/1999, 04/25/2004  . Tdap 05/30/2014    Conditions to be addressed/monitored:  Allergic rhinitis, constipation, Osteopenia, Vitamin D deficiency  There are no care plans that you recently modified to display for this patient.    Medication Assistance: {MEDASSISTANCEINFO:25044}  Patient's preferred pharmacy is:  [facility] [place], [place] - [address] [place] [address] Phone: [phone] Fax: [phone]  [facility] [place], [address] [place] [address] Phone: [phone] Fax: [phone]  [facility] (New Address) - [place], [place] AT Previously: [address] [address] Phone: [phone] Fax: [phone]  [place] [place], [place], [place], [place] [place] [address] Phone: [phone] Fax: [phone]  Uses pill box? {Yes or If no, why not?:20788} Pt endorses ***% compliance  We discussed: {Pharmacy options:24294} Patient decided to: {US Pharmacy Plan:23885}  Care Plan and Follow Up Patient Decision:  {FOLLOWUP:24991}  Plan: {CM FOLLOW UP PLAN:25073}  ***  Current Barriers:  . {pharmacybarriers:24917} . ***  Pharmacist Clinical Goal(s):  [name] Patient will {PHARMACYGOALCHOICES:24921} through collaboration with PharmD and provider.  . ***  Interventions: . 1:1 collaboration with Buelah Manis, Modena Nunnery, MD regarding development and update of comprehensive plan of care as evidenced by provider attestation and co-signature . Inter-disciplinary care team  collaboration (see longitudinal plan of care) . Comprehensive medication review performed; medication list updated in electronic medical record   Osteopenia (Goal ***) -{US controlled/uncontrolled:25276} -Last DEXA Scan: ***   T-Score femoral neck: ***  T-Score total hip: ***  T-Score lumbar spine: ***  T-Score forearm radius: ***  10-year probability of major osteoporotic fracture: ***  10-year probability of hip fracture: *** -Patient {is;is not an osteoporosis  candidate:23886} -Current treatment  . Calcium + Vitamin D -Medications previously tried: ***  -{Osteoporosis Counseling:23892} -{CCMPHARMDINTERVENTION:25122}  Constipation (Goal: ***) -{US controlled/uncontrolled:25276} -Current treatment  . Amitiza 19mg daily -Medications previously tried: ***  -{CCMPHARMDINTERVENTION:25122}  Allergic Rhinitis (Goal: ***) -{US controlled/uncontrolled:25276} -Current treatment  . Montelukast 160m-Medications previously tried: ***  -{CCMPHARMDINTERVENTION:25122}  Vitamin D deficiency (Goal: ***) -{US controlled/uncontrolled:25276} -Current treatment  . Vitamin D 1000 units daily -Medications previously tried: ***  -{CCMPHARMDINTERVENTION:25122}   Patient Goals/Self-Care Activities . Patient will:  - {pharmacypatientgoals:24919}  Follow Up Plan: {CM FOLLOW UP PLYOKH:99774}

## 2020-08-06 ENCOUNTER — Telehealth: Payer: Self-pay | Admitting: Family Medicine

## 2020-08-06 ENCOUNTER — Telehealth: Payer: Medicare HMO

## 2020-08-06 NOTE — Telephone Encounter (Signed)
Copied from [place] [phone]. Topic: Medicare AWV >> Aug 06, 2020 10:07 AM Cher Nakai R wrote: Reason for CRM:   Left message for patient to call back and schedule Medicare Annual Wellness Visit (AWV) in office.   If not able to come in office, please offer to do virtually or by telephone.   Last AWV: 06/24/2019  Please schedule at anytime with [facility]-Nurse Health Advisor.  If any questions, please contact me at [phone]

## 2020-08-06 NOTE — Telephone Encounter (Signed)
Error - patient had AWVS 07/01/2020 not due again until 07/02/2021

## 2020-09-23 ENCOUNTER — Other Ambulatory Visit: Payer: Self-pay

## 2020-09-23 ENCOUNTER — Ambulatory Visit
Admission: RE | Admit: 2020-09-23 | Discharge: 2020-09-23 | Disposition: A | Discharge status: Home / Self Care | Payer: Medicare HMO | Source: Ambulatory Visit | Attending: Family Medicine | Admitting: Family Medicine

## 2020-09-23 DIAGNOSIS — Z1231 Encounter for screening mammogram for malignant neoplasm of breast: Secondary | ICD-10-CM

## 2020-12-10 DIAGNOSIS — R0981 Nasal congestion: Secondary | ICD-10-CM | POA: Diagnosis not present

## 2020-12-10 DIAGNOSIS — U071 COVID-19: Secondary | ICD-10-CM | POA: Diagnosis not present

## 2020-12-10 DIAGNOSIS — R509 Fever, unspecified: Secondary | ICD-10-CM | POA: Diagnosis not present

## 2020-12-17 DIAGNOSIS — Z20822 Contact with and (suspected) exposure to covid-19: Secondary | ICD-10-CM | POA: Diagnosis not present

## 2020-12-23 DIAGNOSIS — Z20822 Contact with and (suspected) exposure to covid-19: Secondary | ICD-10-CM | POA: Diagnosis not present

## 2020-12-30 DIAGNOSIS — Z20822 Contact with and (suspected) exposure to covid-19: Secondary | ICD-10-CM | POA: Diagnosis not present

## 2021-01-01 ENCOUNTER — Ambulatory Visit (INDEPENDENT_AMBULATORY_CARE_PROVIDER_SITE_OTHER): Payer: Medicare HMO | Admitting: Nurse Practitioner

## 2021-01-01 ENCOUNTER — Encounter: Payer: Self-pay | Admitting: Nurse Practitioner

## 2021-01-01 DIAGNOSIS — R200 Anesthesia of skin: Secondary | ICD-10-CM | POA: Diagnosis not present

## 2021-01-01 DIAGNOSIS — R1032 Left lower quadrant pain: Secondary | ICD-10-CM | POA: Diagnosis not present

## 2021-01-01 NOTE — Progress Notes (Signed)
Subjective:    Patient ID: Stephanie Wall, female    DOB: 1955/09/03, 65 y.o.   MRN: [id number]  HPI: Stephanie Wall is a 65 y.o. female presenting virtually for symptoms after COVID-19.  Chief Complaint  Patient presents with   Covid Positive   Patient reports she was diagnosed with COVID in the middle of last month.  She had very little energy and could not do anything that she normally does.    She started having numbness in her left arm during this time and it is still present.  ABDOMINAL PAIN  Reports this started during COVID-19.  She was previously taking Amitiza for constipation which helped, however her insurance stopped covering it.   Duration: weeks Onset: gradual Severity: 7/10  Quality: aching, right before having BM Location:  LLQ  Episode duration: minutes Radiation: no Frequency: multiple times week Status: fluctuating Dysgeusia: yes Fever: no Nausea: no Vomiting: no Weight loss:  yes; 10s Decreased appetite: yes Diarrhea: no Constipation: yes Night sweats: no Chills: yes, stays cold all of the time since COVID-19 Blood in stool: no Heartburn: no Jaundice: no Rash: no Dysuria/urinary frequency: no Hematuria: no  Allergies  Allergen Reactions   Demerol Hives   Dilaudid [Hydromorphone Hcl] Swelling    Could not swallow   Morphine And Related Hives    Outpatient Encounter Medications as of 01/01/2021  Medication Sig   albuterol (PROAIR HFA) 108 (90 Base) MCG/ACT inhaler Inhale 4 puffs into the lungs every 4 (four) hours as needed for wheezing or shortness of breath.   CALCIUM PO Take 1,200 mg by mouth daily.   cholecalciferol (VITAMIN D) 1000 units tablet Take 1,000 Units by mouth daily.   EPINEPHrine 0.3 mg/0.3 mL IJ SOAJ injection Inject 0.3 mg into the muscle as needed for anaphylaxis.   fluticasone (FLOVENT HFA) 110 MCG/ACT inhaler Inhale 2 puffs into the lungs 2 (two) times daily.   montelukast (SINGULAIR) 10 MG tablet TAKE 1 TABLET(10  MG) BY MOUTH AT BEDTIME   omeprazole (PRILOSEC) 40 MG capsule Take 1 capsule (40 mg total) by mouth daily.   [DISCONTINUED] lubiprostone (AMITIZA) 8 MCG capsule Take 1 capsule (8 mcg total) by mouth 2 (two) times daily with a meal.   [DISCONTINUED] 0.9 %  sodium chloride infusion    No facility-administered encounter medications on file as of 01/01/2021.    Patient Active Problem List   Diagnosis Date Noted   Osteopenia 09/30/2019   Vitamin D deficiency 06/10/2015   Constipation, chronic 05/26/2015   Allergy-induced asthma 05/30/2014   Routine general medical examination at a health care facility 07/01/2013   Obesity (BMI 30-39.9) 07/01/2013   Allergic rhinitis 01/25/2009   FIBROIDS, UTERUS 01/21/2009   PAP SMEAR, ABNORMAL 01/21/2009    Past Medical History:  Diagnosis Date   Abnormal glandular Papanicolaou smear of cervix    ALLERGIC RHINITIS    Allergy    Arthritis    Asthma    allergy induced   Back pain    Cervicalgia    Fibroids    uterus   GERD (gastroesophageal reflux disease)    Obesity, unspecified    Snores    Wears glasses     Relevant past medical, surgical, family and social history reviewed and updated as indicated. Interim medical history since our last visit reviewed.  Review of Systems Per HPI unless specifically indicated above     Objective:    There were no vitals taken for this visit.  Wt Readings from  Last 3 Encounters:  07/01/20 238 lb 12.8 oz (108.3 kg)  02/28/20 233 lb (105.7 kg)  02/27/20 233 lb (105.7 kg)    Physical Exam Unable to perform physical examination secondary to lack of equipment.      Assessment & Plan:  1. Left lower quadrant abdominal pain Acute.  I suspect this may be related to constipation.  However, I am slightly concerned because of the lack of appetite and weight loss.  We will treat the constipation with Miralax twice daily and then add in stool softener as needed and have the patient follow up in 2 weeks.  She  denies signs of acute infection like diverticulitis today.  If s/s infection develop, she should be seen emergently and she verbalized understanding.   2. Left arm numbness Acute.  Unclear etiology and unable to perform physical examination today.  I have asked the patient to schedule a 2 week follow up with me and we will assess at that time.      Follow up plan: Return in about 2 weeks (around 01/15/2021) for in person follow up.   This visit was completed via telephone due to the restrictions of the COVID-19 pandemic. All issues as above were discussed and addressed but no physical exam was performed. If it was felt that the patient should be evaluated in the office, they were directed there. The patient verbally consented to this visit. Patient was unable to complete an audio/visual visit due to Lack of equipment. Location of the patient: home Location of the provider: work Those involved with this call:  Provider: Noemi Chapel, DNP, FNP-C CMA: n/a Front Desk/Registration: Vevelyn Pat  Time spent on call:  12 minutes on the phone discussing health concerns. 30 minutes total spent in review of patient's record and preparation of their chart. I verified patient identity using two factors (patient name and date of birth). Patient consents verbally to being seen via telemedicine visit today.

## 2021-01-01 NOTE — Patient Instructions (Signed)
Start taking Miralax 1 capful twice daily in the morning and at night.  If this does not help produce a soft bowel movement after a couple of days, start either Ducolax or Colace daily.  If still no results after a couple of days, let me know.

## 2021-01-06 ENCOUNTER — Telehealth: Payer: Self-pay | Admitting: Family Medicine

## 2021-01-06 NOTE — Telephone Encounter (Signed)
Patient left voicemail to request call for scheduling appointment; referral set up by NP Edsel Petrin during last visit. Patient requesting call for scheduling at [phone].

## 2021-01-15 ENCOUNTER — Encounter: Payer: Self-pay | Admitting: Nurse Practitioner

## 2021-01-15 ENCOUNTER — Other Ambulatory Visit: Payer: Self-pay

## 2021-01-15 ENCOUNTER — Ambulatory Visit (INDEPENDENT_AMBULATORY_CARE_PROVIDER_SITE_OTHER): Payer: Medicare HMO | Admitting: Nurse Practitioner

## 2021-01-15 VITALS — BP 112/66 | HR 65 | Temp 98.5°F | Ht 72.0 in | Wt 236.6 lb

## 2021-01-15 DIAGNOSIS — E559 Vitamin D deficiency, unspecified: Secondary | ICD-10-CM

## 2021-01-15 DIAGNOSIS — R002 Palpitations: Secondary | ICD-10-CM

## 2021-01-15 DIAGNOSIS — Z23 Encounter for immunization: Secondary | ICD-10-CM

## 2021-01-15 DIAGNOSIS — R1031 Right lower quadrant pain: Secondary | ICD-10-CM | POA: Diagnosis not present

## 2021-01-15 LAB — CBC WITH DIFFERENTIAL/PLATELET
Absolute Monocytes: 390 cells/uL (ref 200–950)
Basophils Absolute: 31 cells/uL (ref 0–200)
Basophils Relative: 0.6 %
Eosinophils Absolute: 120 cells/uL (ref 15–500)
Eosinophils Relative: 2.3 %
HCT: 39.7 % (ref 35.0–45.0)
Hemoglobin: 12.4 g/dL (ref 11.7–15.5)
Lymphs Abs: 1794 cells/uL (ref 850–3900)
MCH: 29 pg (ref 27.0–33.0)
MCHC: 31.2 g/dL — ABNORMAL LOW (ref 32.0–36.0)
MCV: 93 fL (ref 80.0–100.0)
MPV: 11 fL (ref 7.5–12.5)
Monocytes Relative: 7.5 %
Neutro Abs: 2865 cells/uL (ref 1500–7800)
Neutrophils Relative %: 55.1 %
Platelets: 213 10*3/uL (ref 140–400)
RBC: 4.27 10*6/uL (ref 3.80–5.10)
RDW: 14.5 % (ref 11.0–15.0)
Total Lymphocyte: 34.5 %
WBC: 5.2 10*3/uL (ref 3.8–10.8)

## 2021-01-15 LAB — TSH: TSH: 1.37 mIU/L (ref 0.40–4.50)

## 2021-01-15 LAB — VITAMIN D 25 HYDROXY (VIT D DEFICIENCY, FRACTURES): Vit D, 25-Hydroxy: 28 ng/mL — ABNORMAL LOW (ref 30–100)

## 2021-01-15 NOTE — Progress Notes (Signed)
Subjective:    Patient ID: Stephanie Wall, female    DOB: December 24, 1955, 65 y.o.   MRN: [id number]  HPI: Stephanie Wall is a 65 y.o. female presenting for follow up.   Chief Complaint  Patient presents with   Follow-up    ABDOMINAL PAIN  Reports abdominal pain is all of the way better.  She is taking miralax daily and having a bowel movement daily which has helped relieve the pain.  On examination today, she is extremely tender in the right lower quadrant.   Duration: acute Onset: better Severity: moderate to severe Quality: sharp Location:  LLQ, now RLQ  Episode duration: only while pushing in area Radiation: no Frequency: intermittent Alleviating factors: not pushing on abdomen, Miralax for left sided pain Aggravating factors: pushing on abdomen Treatments attempted: Miralax Fever: no Nausea: no Vomiting: no Weight loss: no Decreased appetite: yes; since COVID in August Diarrhea: no Constipation: no Blood in stool: no Heartburn: no Jaundice: no Rash: no Dysuria/urinary frequency: no Hematuria: no  NUMBNESS Arm numbness is better; started doing exercises from  previous physical therapist.  Was in [place] a couple of years ago and also has scoliosis.    PALPITATIONS Patient reports since she had COVID in August, she has been having palpitations.  Duration: months Symptom description: heart beating fast Duration of episode: minutes Frequency: no history of the same Activity when event occurred: does not remember Related to exertion: no Dyspnea: no Chest pain: yes feels like crushing after activity Syncope: no Anxiety/stress: no Nausea/vomiting: no Diaphoresis: no Coronary artery disease: no Congestive heart failure: no Arrhythmia:no Thyroid disease: no Caffeine intake: stopped drinking caffeine 1 week ago Water intake: drinks plenty  Status:  stable Treatments attempted: rest  Has never seen Cardiology for this.  Did have palpitations about 4 years ago  when she was under a lot of stress and was having back pain.    Allergies  Allergen Reactions   Demerol Hives   Dilaudid [Hydromorphone Hcl] Swelling    Could not swallow   Morphine And Related Hives    Outpatient Encounter Medications as of 01/15/2021  Medication Sig   albuterol (PROAIR HFA) 108 (90 Base) MCG/ACT inhaler Inhale 4 puffs into the lungs every 4 (four) hours as needed for wheezing or shortness of breath.   CALCIUM PO Take 1,200 mg by mouth daily.   cholecalciferol (VITAMIN D) 1000 units tablet Take 1,000 Units by mouth daily.   EPINEPHrine 0.3 mg/0.3 mL IJ SOAJ injection Inject 0.3 mg into the muscle as needed for anaphylaxis.   fluticasone (FLOVENT HFA) 110 MCG/ACT inhaler Inhale 2 puffs into the lungs 2 (two) times daily.   montelukast (SINGULAIR) 10 MG tablet TAKE 1 TABLET(10 MG) BY MOUTH AT BEDTIME   omeprazole (PRILOSEC) 40 MG capsule Take 1 capsule (40 mg total) by mouth daily.   No facility-administered encounter medications on file as of 01/15/2021.    Patient Active Problem List   Diagnosis Date Noted   Osteopenia 09/30/2019   Vitamin D deficiency 06/10/2015   Constipation, chronic 05/26/2015   Allergy-induced asthma 05/30/2014   Routine general medical examination at a health care facility 07/01/2013   Obesity (BMI 30-39.9) 07/01/2013   Allergic rhinitis 01/25/2009   FIBROIDS, UTERUS 01/21/2009   PAP SMEAR, ABNORMAL 01/21/2009    Past Medical History:  Diagnosis Date   Abnormal glandular Papanicolaou smear of cervix    ALLERGIC RHINITIS    Allergy    Arthritis    Asthma  allergy induced   Back pain    Cervicalgia    Fibroids    uterus   GERD (gastroesophageal reflux disease)    Obesity, unspecified    Snores    Wears glasses     Relevant past medical, surgical, family and social history reviewed and updated as indicated. Interim medical history since our last visit reviewed.  Review of Systems Per HPI unless specifically indicated  above     Objective:    BP 112/66   Pulse 65   Temp 98.5 F (36.9 C) (Oral)   Wt 236 lb 9.6 oz (107.3 kg)   SpO2 97%   BMI 32.09 kg/m   Wt Readings from Last 3 Encounters:  01/15/21 236 lb 9.6 oz (107.3 kg)  07/01/20 238 lb 12.8 oz (108.3 kg)  02/28/20 233 lb (105.7 kg)    Physical Exam Vitals and nursing note reviewed.  Constitutional:      General: She is not in acute distress.    Appearance: Normal appearance. She is obese. She is not toxic-appearing.  HENT:     Head: Normocephalic and atraumatic.     Right Ear: External ear normal.     Left Ear: External ear normal.  Eyes:     General: No scleral icterus.    Extraocular Movements: Extraocular movements intact.  Neck:     Vascular: No carotid bruit.  Cardiovascular:     Rate and Rhythm: Normal rate and regular rhythm.     Heart sounds: Normal heart sounds. No murmur heard. Pulmonary:     Effort: Pulmonary effort is normal. No respiratory distress.     Breath sounds: Normal breath sounds. No wheezing, rhonchi or rales.  Abdominal:     General: Abdomen is flat. Bowel sounds are normal.     Palpations: Abdomen is soft.     Tenderness: There is abdominal tenderness in the right lower quadrant.    Musculoskeletal:        General: No swelling or tenderness. Normal range of motion.     Cervical back: Normal range of motion.  Skin:    General: Skin is warm and dry.     Capillary Refill: Capillary refill takes less than 2 seconds.     Coloration: Skin is not jaundiced.     Findings: No bruising.  Neurological:     General: No focal deficit present.     Mental Status: She is alert and oriented to person, place, and time.     Motor: No weakness.     Gait: Gait normal.  Psychiatric:        Mood and Affect: Mood normal.        Behavior: Behavior normal.        Thought Content: Thought content normal.        Judgment: Judgment normal.       Assessment & Plan:  1. Vitamin D deficiency Chronic.  We will check  vitamin D level today and recommend supplementation as indicated.  - VITAMIN D 25 Hydroxy (Vit-D Deficiency, Fractures)  2. RLQ abdominal pain Acute.  Only noted with deep palpation to right lower quadrant.  No red flag signs or symptoms today-no fevers, nausea/vomiting, unexplained weight loss.  CT pelvis with contrast in 2005 showed uterus consistent with fibroids, appendix appeared normal at that time.  We will check a blood count today to make sure not currently appendicitis.  Suspect pain may be related to inflammation and bowel from constipation that has recently resolved.  If  white blood count elevated, we we will check a CT scan of abdomen pelvis.  Otherwise, we can check a pelvic ultrasound or continue to monitor.  - CBC with Differential  3. Palpitations Acute.  EKG today similar when compared with previous EKG.  Will check TSH today.  We will also place referral to cardiology for possible event monitor at this is happened 1 time in the past but resolved completely.  Does not appear related to stress or activity.  - EKG 12-Lead - TSH  4. Need for immunization against influenza  - Flu Vaccine QUAD High Dose(Fluad)  Follow up plan: Return in about 6 months (around 07/15/2021) for follow up.

## 2021-01-17 ENCOUNTER — Encounter: Payer: Self-pay | Admitting: Nurse Practitioner

## 2021-03-08 ENCOUNTER — Ambulatory Visit (HOSPITAL_BASED_OUTPATIENT_CLINIC_OR_DEPARTMENT_OTHER): Payer: Medicare HMO | Admitting: Cardiology

## 2021-03-08 ENCOUNTER — Encounter (HOSPITAL_BASED_OUTPATIENT_CLINIC_OR_DEPARTMENT_OTHER): Payer: Self-pay | Admitting: Cardiology

## 2021-03-08 ENCOUNTER — Other Ambulatory Visit: Payer: Self-pay

## 2021-03-08 VITALS — BP 140/77 | HR 64 | Ht 66.0 in | Wt 238.0 lb

## 2021-03-08 DIAGNOSIS — R0789 Other chest pain: Secondary | ICD-10-CM

## 2021-03-08 DIAGNOSIS — R002 Palpitations: Secondary | ICD-10-CM | POA: Diagnosis not present

## 2021-03-08 DIAGNOSIS — Z7189 Other specified counseling: Secondary | ICD-10-CM

## 2021-03-08 DIAGNOSIS — R030 Elevated blood-pressure reading, without diagnosis of hypertension: Secondary | ICD-10-CM

## 2021-03-08 DIAGNOSIS — Z8616 Personal history of COVID-19: Secondary | ICD-10-CM

## 2021-03-08 NOTE — Patient Instructions (Addendum)
Medication Instructions:  Your Physician recommend you continue on your current medication as directed.    *If you need a refill on your cardiac medications before your next appointment, please call your pharmacy*   Lab Work: None ordered today   Testing/Procedures: None ordered today   Follow-Up: At [facility], you and your health needs are our priority.  As part of our continuing mission to provide you with exceptional heart care, we have created designated Provider Care Teams.  These Care Teams include your primary Cardiologist (physician) and Advanced Practice Providers (APPs -  Physician Assistants and Nurse Practitioners) who all work together to provide you with the care you need, when you need it.  We recommend signing up for the patient portal called "MyChart".  Sign up information is provided on this After Visit Summary.  MyChart is used to connect with patients for Virtual Visits (Telemedicine).  Patients are able to view lab/test results, encounter notes, upcoming appointments, etc.  Non-urgent messages can be sent to your provider as well.   To learn more about what you can do with MyChart, go to [email or web address].    Your next appointment:   As needed  The format for your next appointment:   In Person  Provider:   Buford Dresser, MD    [place]: Website: [email or web address]  DR. Harrell Gave RECOMMENDS YOU PURCHASE  " Kardia" By [employer] FROM THE  [employer]  APP PLAY STORE.  THE APP IS FREE , BUT THE  EQUIPMENT HAS A COST. IT ALLOWS YOU TO OBTAIN A RECORDING OF YOUR HEART RATE AND RHYTHM BY PROVIDING A SHORT STRIP THAT YOU CAN SHARE WITH YOUR PROVIDER.

## 2021-03-08 NOTE — Progress Notes (Signed)
Cardiology Office Note:    Date:  03/10/2021   ID:  Stephanie Wall, DOB October 14, 1955, MRN [id number]  PCP:  Eulogio Bear, NP  Cardiologist:  Buford Dresser, MD  Referring MD: Eulogio Bear, NP   CC: new patient evaluation for palpitations   History of Present Illness:    Stephanie Wall is a 65 y.o. female with a hx of GERD, obesity, arthritis, and asthma, who is seen as a new consult at the request of Eulogio Bear, NP for the evaluation and management of palpitations.  Stephanie Wall saw her PCP 01/15/2021. She reported having palpitations since she was infected with Covid in August 2022. She was referred to cardiology for a possible event monitor.   Tachycardia/palpitations: -Initial onset: At times, she has felt like her heart was "fluttering" since before her infection with Covid.  -Frequency/Duration: Since her last clinic visit she denies feeling any palpitations or chest pain. -Associated symptoms: After Covid, she noticed fast heart rates and sharp, stabbing, chest pains. -Aggravating/alleviating factors: Rapid heart beats after eating -Caffeine: 1 cup of coffee a day. Currently caffeine-free for the past 7 days. She has not noticed much difference. -Alcohol: None -Tobacco: None -OTC supplements: -Labs: TSH, kidney function/electrolytes, CBC reviewed. -Cardiac ROS: +stabbing chest pain, no shortness of breath, no PND, no orthopnea, +LE edema. -Family history: Maternal grandmother had CHF. Grandfather had mini-strokes in his 54's.  At home her blood pressure is usually well-controlled.   Occasionally, she will feel a "woozy" lightheadedness while sitting.  Normally she has some bilateral LE edema that will dissipate by morning.  In the morning she suffers from neck pain, as well as numbness in her left hand which she believes is related to her neck pain.   She also endorses asthma and hot flashes.  She denies any shortness of breath. No  headaches, syncope, orthopnea, or PND  Past Medical History:  Diagnosis Date   Abnormal glandular Papanicolaou smear of cervix    ALLERGIC RHINITIS    Allergy    Arthritis    Asthma    allergy induced   Back pain    Cervicalgia    Fibroids    uterus   GERD (gastroesophageal reflux disease)    Obesity, unspecified    Snores    Wears glasses     Past Surgical History:  Procedure Laterality Date   BREAST CYST EXCISION  1984 and [place] Bilateral 07/16/2012   Procedure: BILATERAL BREAST REDUCTION  (BREAST);  Surgeon: Charlene Brooke, MD;  Location: [facility];  Service: Plastics;  Laterality: Bilateral;   CHOLECYSTECTOMY  1976   COLONOSCOPY     REDUCTION MAMMAPLASTY      Current Medications: Current Outpatient Medications on File Prior to Visit  Medication Sig   albuterol (PROAIR HFA) 108 (90 Base) MCG/ACT inhaler Inhale 4 puffs into the lungs every 4 (four) hours as needed for wheezing or shortness of breath.   CALCIUM PO Take 1,200 mg by mouth daily.   cholecalciferol (VITAMIN D) 1000 units tablet Take 1,000 Units by mouth daily.   EPINEPHrine 0.3 mg/0.3 mL IJ SOAJ injection Inject 0.3 mg into the muscle as needed for anaphylaxis.   fluticasone (FLOVENT HFA) 110 MCG/ACT inhaler Inhale 2 puffs into the lungs 2 (two) times daily.   montelukast (SINGULAIR) 10 MG tablet TAKE 1 TABLET(10 MG) BY MOUTH AT BEDTIME   omeprazole (PRILOSEC) 40 MG capsule Take 1 capsule (40 mg total) by mouth daily.  No current facility-administered medications on file prior to visit.     Allergies:   Demerol, Dilaudid [hydromorphone hcl], and Morphine and related   Social History   Tobacco Use   Smoking status: Never   Smokeless tobacco: Never  Vaping Use   Vaping Use: Never used  Substance Use Topics   Alcohol use: No   Drug use: No    Family History: family history includes Allergic rhinitis in her mother; Arthritis in her maternal grandmother  and paternal grandmother; Birth defects in her mother; Cancer (age of onset: 21) in her maternal grandmother; Diabetes in her maternal grandmother; HIV in her sister; Heart disease in her maternal grandfather, maternal grandmother, and paternal aunt; Hypertension in her father, maternal grandmother, mother, and paternal grandmother; Kidney disease in her paternal grandmother; Stroke in her paternal grandfather; Thyroid disease in her maternal grandmother. There is no history of Asthma, Angioedema, Atopy, Eczema, Immunodeficiency, Urticaria, Colon cancer, Esophageal cancer, Pancreatic cancer, Rectal cancer, or Stomach cancer.  ROS:   Please see the history of present illness.  Additional pertinent ROS: Constitutional: Negative for chills, fever, night sweats, unintentional weight loss  HENT: Negative for ear pain and hearing loss.   Eyes: Negative for loss of vision and eye pain.  Respiratory: Negative for cough, sputum, wheezing.   Cardiovascular: See HPI. Gastrointestinal: Negative for abdominal pain, melena, and hematochezia.  Genitourinary: Negative for dysuria and hematuria.  Musculoskeletal: Negative for falls and myalgias.  Skin: Negative for itching and rash.  Neurological: Negative for focal weakness, focal sensory changes and loss of consciousness.  Endo/Heme/Allergies: Does not bruise/bleed easily.     EKGs/Labs/Other Studies Reviewed:    The following studies were reviewed today: No prior cardiovascular studies available.   EKG:  EKG is personally reviewed.   03/08/2021: NSR, iRBBB  Recent Labs: 07/01/2020: ALT 16; BUN 12; Creat 0.94; Potassium 4.3; Sodium 142 01/15/2021: Hemoglobin 12.4; Platelets 213; TSH 1.37   Recent Lipid Panel    Component Value Date/Time   CHOL 171 07/01/2020 0823   TRIG 77 07/01/2020 0823   HDL 64 07/01/2020 0823   CHOLHDL 2.7 07/01/2020 0823   VLDL 13 06/24/2016 1008   LDLCALC 90 07/01/2020 0823    Physical Exam:    VS:  BP 140/77   Pulse  64   Ht 5\' 6"  (1.676 m)   Wt 238 lb (108 kg)   SpO2 98%   BMI 38.41 kg/m     Wt Readings from Last 3 Encounters:  03/08/21 238 lb (108 kg)  01/15/21 236 lb 9.6 oz (107.3 kg)  07/01/20 238 lb 12.8 oz (108.3 kg)    GEN: Well nourished, well developed in no acute distress HEENT: Normal, moist mucous membranes NECK: No JVD CARDIAC: regular rhythm, normal S1 and S2, no rubs or gallops. No murmur. VASCULAR: Radial and DP pulses 2+ bilaterally. No carotid bruits RESPIRATORY:  Clear to auscultation without rales, wheezing or rhonchi  ABDOMEN: Soft, non-tender, non-distended MUSCULOSKELETAL:  Ambulates independently SKIN: Warm and dry, trivial bilateral LE edema (R>L) NEUROLOGIC:  Alert and oriented x 3. No focal neuro deficits noted. PSYCHIATRIC:  Normal affect    ASSESSMENT:    1. Palpitation   2. Atypical chest pain   3. Personal history of COVID-19   4. Cardiac risk counseling   5. Counseling on health promotion and disease prevention    PLAN:    Palpitations, atypical chest pain post Covid: -symptoms now resolved -We spent significant time today reviewing different parts of the  cardiovascular system (electrical, vascular, functional, and valvular). We discussed how each of these systems can present with different symptoms. We reviewed that there are different ways we evaluate these symptoms with tests. -based on lack of current symptoms, suspect monitor will be low yield at this time. If symptoms recur and remain, she will contact me and we can send her a monitor -for now, would consider device like [place] for home evaluation. -reviewed red flag warning signs that need immediate medical attention  Elevated blood pressure reading: denies history of hypertension. Monitor, if remains consistently >782 systolic would treat.  Cardiac risk counseling and prevention recommendations: -recommend heart healthy/Mediterranean diet, with whole grains, fruits, vegetable, fish, lean  meats, nuts, and olive oil. Limit salt. -recommend moderate walking, 3-5 times/week for 30-50 minutes each session. Aim for at least 150 minutes.week. Goal should be pace of 3 miles/hours, or walking 1.5 miles in 30 minutes -recommend avoidance of tobacco products. Avoid excess alcohol. -ASCVD risk score: The 10-year ASCVD risk score (Arnett DK, et al., 2019) is: 9.6%   Values used to calculate the score:     Age: 28 years     Sex: Female     Is Non-Hispanic African American: Yes     Diabetic: No     Tobacco smoker: No     Systolic Blood Pressure: 956 mmHg     Is BP treated: No     HDL Cholesterol: 64 mg/dL     Total Cholesterol: 171 mg/dL    Plan for follow up: PRN.  Buford Dresser, MD, PhD, [facility]    Medication Adjustments/Labs and Tests Ordered: Current medicines are reviewed at length with the patient today.  Concerns regarding medicines are outlined above.   Orders Placed This Encounter  Procedures   EKG 12-Lead    No orders of the defined types were placed in this encounter.  Patient Instructions  Medication Instructions:  Your Physician recommend you continue on your current medication as directed.    *If you need a refill on your cardiac medications before your next appointment, please call your pharmacy*   Lab Work: None ordered today   Testing/Procedures: None ordered today   Follow-Up: At [facility], you and your health needs are our priority.  As part of our continuing mission to provide you with exceptional heart care, we have created designated Provider Care Teams.  These Care Teams include your primary Cardiologist (physician) and Advanced Practice Providers (APPs -  Physician Assistants and Nurse Practitioners) who all work together to provide you with the care you need, when you need it.  We recommend signing up for the patient portal called "MyChart".  Sign up information is provided on this After Visit Summary.   MyChart is used to connect with patients for Virtual Visits (Telemedicine).  Patients are able to view lab/test results, encounter notes, upcoming appointments, etc.  Non-urgent messages can be sent to your provider as well.   To learn more about what you can do with MyChart, go to [email or web address].    Your next appointment:   As needed  The format for your next appointment:   In Person  Provider:   Buford Dresser, MD    [place]: Website: [email or web address]  DR. Harrell Gave RECOMMENDS YOU PURCHASE  " Kardia" By [employer] FROM THE  [employer]  APP PLAY STORE.  THE APP IS FREE , BUT THE  EQUIPMENT HAS A COST. IT ALLOWS YOU TO OBTAIN A RECORDING  OF YOUR HEART RATE AND RHYTHM BY PROVIDING A SHORT STRIP THAT YOU CAN SHARE WITH YOUR PROVIDER.       I,Mathew Stumpf,acting as a Education administrator for [employer], MD.,have documented all relevant documentation on the behalf of Buford Dresser, MD,as directed by  Buford Dresser, MD while in the presence of Buford Dresser, MD.  I, Buford Dresser, MD, have reviewed all documentation for this visit. The documentation on 03/10/21 for the exam, diagnosis, procedures, and orders are all accurate and complete.   Signed, Buford Dresser, MD PhD 03/10/2021 8:25 AM    [place]

## 2021-03-10 ENCOUNTER — Encounter (HOSPITAL_BASED_OUTPATIENT_CLINIC_OR_DEPARTMENT_OTHER): Payer: Self-pay | Admitting: Cardiology

## 2021-03-13 ENCOUNTER — Other Ambulatory Visit: Payer: Self-pay | Admitting: Gastroenterology

## 2021-03-13 DIAGNOSIS — R1310 Dysphagia, unspecified: Secondary | ICD-10-CM

## 2021-03-17 DIAGNOSIS — H5203 Hypermetropia, bilateral: Secondary | ICD-10-CM | POA: Diagnosis not present

## 2021-03-24 DIAGNOSIS — H43811 Vitreous degeneration, right eye: Secondary | ICD-10-CM | POA: Diagnosis not present

## 2021-03-29 ENCOUNTER — Telehealth: Payer: Self-pay

## 2021-03-29 ENCOUNTER — Other Ambulatory Visit: Payer: Self-pay

## 2021-03-29 ENCOUNTER — Telehealth (INDEPENDENT_AMBULATORY_CARE_PROVIDER_SITE_OTHER): Payer: Medicare HMO | Admitting: Nurse Practitioner

## 2021-03-29 ENCOUNTER — Encounter: Payer: Self-pay | Admitting: Nurse Practitioner

## 2021-03-29 VITALS — HR 80

## 2021-03-29 DIAGNOSIS — J069 Acute upper respiratory infection, unspecified: Secondary | ICD-10-CM | POA: Diagnosis not present

## 2021-03-29 DIAGNOSIS — J4521 Mild intermittent asthma with (acute) exacerbation: Secondary | ICD-10-CM

## 2021-03-29 MED ORDER — AZITHROMYCIN 250 MG PO TABS: ORAL_TABLET | ORAL | 0 refills | Status: DC

## 2021-03-29 MED ORDER — AZITHROMYCIN 250 MG PO TABS: ORAL_TABLET | ORAL | 0 refills | Status: AC

## 2021-03-29 MED ORDER — PREDNISONE 20 MG PO TABS: 40.0000 mg | ORAL_TABLET | Freq: Every day | ORAL | 0 refills | Status: DC

## 2021-03-29 MED ORDER — PREDNISONE 20 MG PO TABS: 40.0000 mg | ORAL_TABLET | Freq: Every day | ORAL | 0 refills | Status: AC

## 2021-03-29 MED ORDER — ALBUTEROL SULFATE (2.5 MG/3ML) 0.083% IN NEBU: 2.5000 mg | INHALATION_SOLUTION | Freq: Four times a day (QID) | RESPIRATORY_TRACT | 1 refills | Status: DC | PRN

## 2021-03-29 MED ORDER — ALBUTEROL SULFATE (2.5 MG/3ML) 0.083% IN NEBU
2.5000 mg | INHALATION_SOLUTION | Freq: Four times a day (QID) | RESPIRATORY_TRACT | 1 refills | Status: DC | PRN
Start: 2021-03-29 — End: 2021-03-29

## 2021-03-29 NOTE — Telephone Encounter (Signed)
Medications resent to [facility] [address]

## 2021-03-29 NOTE — Telephone Encounter (Signed)
Pt called in stating that she just had a virtual visit with NP Noemi Chapel and would like for the prescription to be sent to the [facility] on [address]. Please advise  Cb#: [phone]

## 2021-03-29 NOTE — Progress Notes (Signed)
Subjective:    Patient ID: Stephanie Wall, female    DOB: 1956-03-10, 65 y.o.   MRN: [id number]  HPI: Stephanie Wall is a 65 y.o. female presenting virtually for cough and sore throat.  Chief Complaint  Patient presents with   Asthma   URI   UPPER RESPIRATORY TRACT INFECTION Onset: 11/28 COVID-19 testing history: not done COVID-19 vaccination status: has had 4 vaccines Fever: no Body aches: no; had some first couple of day Cough: yes; dry Shortness of breath: yes; with activity Wheezing: yes Chest pain: no Chest tightness: yes Chest congestion: yes Nasal congestion: no Runny nose: no Hoarseness: yes Difficulty swallowing: yes Post nasal drip: yes Sneezing: no Sore throat: yes Swollen glands: yes Sinus pressure: no Headache: no Face pain: no Toothache: no Ear pain: no  Ear pressure: yes  Eyes red/itching:no Eye drainage/crusting: no  Nausea: yes  Vomiting: no Diarrhea: no  Change in appetite:  yes; decreased   Loss of taste/smell: yes; tastes different Rash: no Fatigue: yes Sick contacts:  yes; husband had bacterial infection 2 week  ago Strep contacts: no  Context: better Recurrent sinusitis: no Treatments attempted: albuterol, Mucinex, alka seltzer, flonase  Relief with OTC medications: no  Allergies  Allergen Reactions   Demerol Hives   Dilaudid [Hydromorphone Hcl] Swelling    Could not swallow   Morphine And Related Hives    Outpatient Encounter Medications as of 03/29/2021  Medication Sig   [DISCONTINUED] albuterol (PROVENTIL) (2.5 MG/3ML) 0.083% nebulizer solution Take 3 mLs (2.5 mg total) by nebulization every 6 (six) hours as needed for wheezing or shortness of breath.   [DISCONTINUED] azithromycin (ZITHROMAX) 250 MG tablet Take 2 tablets on day 1, then 1 tablet daily on days 2 through 5   [DISCONTINUED] predniSONE (DELTASONE) 20 MG tablet Take 2 tablets (40 mg total) by mouth daily with breakfast for 5 days.   albuterol (PROAIR HFA) 108  (90 Base) MCG/ACT inhaler Inhale 4 puffs into the lungs every 4 (four) hours as needed for wheezing or shortness of breath.   CALCIUM PO Take 1,200 mg by mouth daily.   cholecalciferol (VITAMIN D) 1000 units tablet Take 1,000 Units by mouth daily.   EPINEPHrine 0.3 mg/0.3 mL IJ SOAJ injection Inject 0.3 mg into the muscle as needed for anaphylaxis.   fluticasone (FLOVENT HFA) 110 MCG/ACT inhaler Inhale 2 puffs into the lungs 2 (two) times daily.   montelukast (SINGULAIR) 10 MG tablet TAKE 1 TABLET(10 MG) BY MOUTH AT BEDTIME   omeprazole (PRILOSEC) 40 MG capsule TAKE 1 CAPSULE(40 MG) BY MOUTH DAILY   No facility-administered encounter medications on file as of 03/29/2021.    Patient Active Problem List   Diagnosis Date Noted   Osteopenia 09/30/2019   Vitamin D deficiency 06/10/2015   Constipation, chronic 05/26/2015   Allergy-induced asthma 05/30/2014   Routine general medical examination at a health care facility 07/01/2013   Obesity (BMI 30-39.9) 07/01/2013   Allergic rhinitis 01/25/2009   FIBROIDS, UTERUS 01/21/2009   PAP SMEAR, ABNORMAL 01/21/2009    Past Medical History:  Diagnosis Date   Abnormal glandular Papanicolaou smear of cervix    ALLERGIC RHINITIS    Allergy    Arthritis    Asthma    allergy induced   Back pain    Cervicalgia    Fibroids    uterus   GERD (gastroesophageal reflux disease)    Obesity, unspecified    Snores    Wears glasses     Relevant past  medical, surgical, family and social history reviewed and updated as indicated. Interim medical history since our last visit reviewed.  Review of Systems Per HPI unless specifically indicated above     Objective:    Pulse 80   SpO2 96%   Wt Readings from Last 3 Encounters:  03/08/21 238 lb (108 kg)  01/15/21 236 lb 9.6 oz (107.3 kg)  07/01/20 238 lb 12.8 oz (108.3 kg)    Physical Exam Nursing note reviewed.  Constitutional:      General: She is not in acute distress.    Appearance: Normal  appearance. She is not ill-appearing or toxic-appearing.  HENT:     Head: Normocephalic and atraumatic.     Right Ear: External ear normal.     Left Ear: External ear normal.     Nose: Nose normal. No congestion or rhinorrhea.     Mouth/Throat:     Mouth: Mucous membranes are moist.     Pharynx: Oropharynx is clear. Posterior oropharyngeal erythema present.  Eyes:     General: No scleral icterus.       Right eye: No discharge.        Left eye: No discharge.     Extraocular Movements: Extraocular movements intact.  Cardiovascular:     Comments: Unable to assess heart sounds via virtual visit. Pulmonary:     Effort: Pulmonary effort is normal. No respiratory distress.     Comments: Unable to assess breath sounds via virtual visit.  Patient talking in complete sentences during telemedicine visit.  No accessory muscle use. Musculoskeletal:        General: Normal range of motion.  Skin:    Coloration: Skin is not jaundiced or pale.     Findings: No erythema.  Neurological:     Mental Status: She is alert and oriented to person, place, and time.  Psychiatric:        Mood and Affect: Mood normal.        Behavior: Behavior normal.        Thought Content: Thought content normal.        Judgment: Judgment normal.       Assessment & Plan:  1. Viral upper respiratory tract infection Acute x1 week.  Encouraged viral testing.  Reassured patient that symptoms and exam findings are most consistent with a viral upper respiratory infection and explained lack of efficacy of antibiotics against viruses.  Discussed expected course and features suggestive of secondary bacterial infection.  Continue supportive care. Increase fluid intake with water or electrolyte solution like pedialyte. Encouraged acetaminophen as needed for fever/pain. Encouraged salt water gargling, chloraseptic spray and throat lozenges. Encouraged OTC guaifenesin. Encouraged saline sinus flushes and/or neti with humidified air.    Concern for asthma exacerbation given history of asthma.  Start prednisone 40 mg daily for 5 days.  If not better in 2 days, also start Z-Pak.  Follow-up with no improvement near the end of the week.  - SARS-CoV-2 RNA (COVID-19) and Respiratory Viral Panel, Qualitative NAAT  2. Mild intermittent asthma with exacerbation Acute.  Suspect exacerbated by acute viral illness.  Refill nebulizer medication, start prednisone taper.  If no better by Wednesday, start Z-Pak.  Follow-up with no improvement by the end of the week.  - albuterol (PROVENTIL) (2.5 MG/3ML) 0.083% nebulizer solution; Take 3 mLs (2.5 mg total) by nebulization every 6 (six) hours as needed for wheezing or shortness of breath.  Dispense: 150 mL; Refill: 1 - predniSONE (DELTASONE) 20 MG tablet; Take  2 tablets (40 mg total) by mouth daily with breakfast for 5 days.  Dispense: 10 tablet; Refill: 0 - azithromycin (ZITHROMAX) 250 MG tablet; Take 2 tablets on day 1, then 1 tablet daily on days 2 through 5  Dispense: 6 tablet; Refill: 0     Follow up plan: Return if symptoms worsen or fail to improve.  Due to the catastrophic nature of the COVID-19 pandemic, this video visit was completed soley via audio and visual contact via Caregility due to the restrictions of the COVID-19 pandemic.  All issues as above were discussed and addressed. Physical exam was done as above through visual confirmation on Caregility. If it was felt that the patient should be evaluated in the office, they were directed there. The patient verbally consented to this visit. Location of the patient: home Location of the provider: work Those involved with this call:  Provider: Noemi Chapel, DNP, FNP-C CMA: n/a Front Desk/Registration: Vevelyn Pat  Time spent on call:  9 minutes with patient face to face via video conference. More than 50% of this time was spent in counseling and coordination of care. 15 minutes total spent in review of patient's record  and preparation of their chart. I verified patient identity using two factors (patient name and date of birth). Patient consents verbally to being seen via telemedicine visit today.

## 2021-04-01 ENCOUNTER — Encounter: Payer: Self-pay | Admitting: Nurse Practitioner

## 2021-04-01 LAB — SARS-COV-2 RNA (COVID-19) RESP VIRAL PNL QL NAAT

## 2021-04-05 ENCOUNTER — Ambulatory Visit (INDEPENDENT_AMBULATORY_CARE_PROVIDER_SITE_OTHER): Payer: Medicare HMO | Admitting: Nurse Practitioner

## 2021-04-05 ENCOUNTER — Encounter: Payer: Self-pay | Admitting: Nurse Practitioner

## 2021-04-05 ENCOUNTER — Other Ambulatory Visit: Payer: Self-pay

## 2021-04-05 VITALS — BP 128/82 | HR 70 | Ht 66.0 in | Wt 236.0 lb

## 2021-04-05 DIAGNOSIS — R051 Acute cough: Secondary | ICD-10-CM

## 2021-04-05 DIAGNOSIS — J4521 Mild intermittent asthma with (acute) exacerbation: Secondary | ICD-10-CM

## 2021-04-05 MED ORDER — SPACER/AERO-HOLDING CHAMBERS DEVI: 1.0000 | Freq: Every day | 0 refills | Status: AC

## 2021-04-05 MED ORDER — BENZONATATE 100 MG PO CAPS: 100.0000 mg | ORAL_CAPSULE | Freq: Two times a day (BID) | ORAL | 0 refills | Status: DC | PRN

## 2021-04-05 NOTE — Progress Notes (Signed)
Subjective:    Patient ID: Stephanie Wall, female    DOB: January 02, 1956, 64 y.o.   MRN: [id number]  HPI: Stephanie Wall is a 65 y.o. female presenting for follow up.  Chief Complaint  Patient presents with   Headache   UPPER RESPIRATORY TRACT INFECTION Onset: 11/28 Tested negative for COVID, all other respiratory viruses last week. Fever: no Chills: yes, occasionally "stays cold." Body aches: no Cough: yes; dry and feels cough Shortness of breath:  yes; with walking and using albuterol twice per day Wheezing: no Chest pain: no Chest tightness: no Chest congestion: yes Nasal congestion: no Runny nose: yes Post nasal drip: yes Sneezing: yes Sore throat: no Swollen glands: no Sinus pressure:  yes; frontal Headache: yes Face pain: no Toothache: no Ear pain:  yes; bilateral   Ear pressure: no  Eyes red/itching:no Eye drainage/crusting:  yes; white in the morning   Nausea: no  Vomiting: no Diarrhea: no  Change in appetite:  yes; decreased   Loss of taste/smell: no  Rash: no Fatigue: yes Sick contacts: no Strep contacts: no  Context: fluctuating Recurrent sinusitis: no Treatments attempted: prednisone, Z pack, albuterol Relief with OTC medications: no  Allergies  Allergen Reactions   Demerol Hives   Dilaudid [Hydromorphone Hcl] Swelling    Could not swallow   Morphine And Related Hives    Outpatient Encounter Medications as of 04/05/2021  Medication Sig   albuterol (PROAIR HFA) 108 (90 Base) MCG/ACT inhaler Inhale 4 puffs into the lungs every 4 (four) hours as needed for wheezing or shortness of breath.   albuterol (PROVENTIL) (2.5 MG/3ML) 0.083% nebulizer solution Take 3 mLs (2.5 mg total) by nebulization every 6 (six) hours as needed for wheezing or shortness of breath.   benzonatate (TESSALON) 100 MG capsule Take 1 capsule (100 mg total) by mouth 2 (two) times daily as needed for cough.   CALCIUM PO Take 1,200 mg by mouth daily.   cholecalciferol  (VITAMIN D) 1000 units tablet Take 1,000 Units by mouth daily.   EPINEPHrine 0.3 mg/0.3 mL IJ SOAJ injection Inject 0.3 mg into the muscle as needed for anaphylaxis.   fluticasone (FLOVENT HFA) 110 MCG/ACT inhaler Inhale 2 puffs into the lungs 2 (two) times daily.   montelukast (SINGULAIR) 10 MG tablet TAKE 1 TABLET(10 MG) BY MOUTH AT BEDTIME   omeprazole (PRILOSEC) 40 MG capsule TAKE 1 CAPSULE(40 MG) BY MOUTH DAILY   Spacer/Aero-Holding Chambers DEVI 1 each by Does not apply route daily.   No facility-administered encounter medications on file as of 04/05/2021.    Patient Active Problem List   Diagnosis Date Noted   Osteopenia 09/30/2019   Vitamin D deficiency 06/10/2015   Constipation, chronic 05/26/2015   Allergy-induced asthma 05/30/2014   Routine general medical examination at a health care facility 07/01/2013   Obesity (BMI 30-39.9) 07/01/2013   Allergic rhinitis 01/25/2009   FIBROIDS, UTERUS 01/21/2009   PAP SMEAR, ABNORMAL 01/21/2009    Past Medical History:  Diagnosis Date   Abnormal glandular Papanicolaou smear of cervix    ALLERGIC RHINITIS    Allergy    Arthritis    Asthma    allergy induced   Back pain    Cervicalgia    Fibroids    uterus   GERD (gastroesophageal reflux disease)    Obesity, unspecified    Snores    Wears glasses     Relevant past medical, surgical, family and social history reviewed and updated as indicated. Interim medical history since  our last visit reviewed.  Review of Systems Per HPI unless specifically indicated above     Objective:    BP 128/82   Pulse 70   Ht 5\' 6"  (1.676 m)   Wt 236 lb (107 kg)   SpO2 99%   BMI 38.09 kg/m   Wt Readings from Last 3 Encounters:  04/05/21 236 lb (107 kg)  03/08/21 238 lb (108 kg)  01/15/21 236 lb 9.6 oz (107.3 kg)    Physical Exam Vitals and nursing note reviewed.  Constitutional:      General: She is not in acute distress.    Appearance: Normal appearance. She is not  toxic-appearing.  HENT:     Head: Normocephalic and atraumatic.     Salivary Glands: Right salivary gland is not diffusely enlarged or tender. Left salivary gland is not diffusely enlarged or tender.     Right Ear: Tympanic membrane, ear canal and external ear normal. There is no impacted cerumen.     Left Ear: Tympanic membrane, ear canal and external ear normal. There is no impacted cerumen.     Nose: Nose normal. No congestion.     Right Sinus: No maxillary sinus tenderness or frontal sinus tenderness.     Left Sinus: No maxillary sinus tenderness or frontal sinus tenderness.     Mouth/Throat:     Mouth: Mucous membranes are moist.     Pharynx: Oropharynx is clear. No oropharyngeal exudate or posterior oropharyngeal erythema.  Eyes:     General: No scleral icterus.       Right eye: No discharge.        Left eye: No discharge.     Extraocular Movements: Extraocular movements intact.  Neck:     Vascular: No carotid bruit.  Cardiovascular:     Rate and Rhythm: Normal rate and regular rhythm.     Heart sounds: Normal heart sounds. No murmur heard. Pulmonary:     Effort: Pulmonary effort is normal. No respiratory distress.     Breath sounds: Normal breath sounds. No stridor. No wheezing, rhonchi or rales.  Musculoskeletal:     Cervical back: Normal range of motion and neck supple.     Right lower leg: No edema.     Left lower leg: No edema.  Lymphadenopathy:     Cervical: No cervical adenopathy.  Skin:    General: Skin is warm and dry.     Capillary Refill: Capillary refill takes less than 2 seconds.     Coloration: Skin is not jaundiced or pale.     Findings: No erythema.  Neurological:     Mental Status: She is alert and oriented to person, place, and time.     Motor: No weakness.     Gait: Gait normal.  Psychiatric:        Mood and Affect: Mood normal.        Behavior: Behavior normal.        Thought Content: Thought content normal.        Judgment: Judgment normal.     Results for orders placed or performed in visit on 03/29/21  SARS-CoV-2 RNA (COVID-19) and Respiratory Viral Panel, Qualitative NAAT  Result Value Ref Range   SARS CoV2 RNA Not Detected Not Detected   Adenovirus B Not Detected Not Detected   Rhinovirus Not Detected Not Detected   Influenza A Not Detected Not Detected   INFLUENZA A SUBTYPE H1 Not Detected Not Detected   INFLUENZA A SUBTYPE H3 Not Detected Not  Detected   Influenza B Not Detected Not Detected   Metapneumovirus Not Detected Not Detected   Respiratory Syncytial Virus A Not Detected Not Detected   Respiratory Syncytial Virus B Not Detected Not Detected   HUMAN PARAINFLU VIRUS 1 Not Detected Not Detected   HUMAN PARAINFLU VIRUS 2 Not Detected Not Detected   HUMAN PARAINFLU VIRUS 3 Not Detected Not Detected   Comment see note       Assessment & Plan:  1. Mild intermittent asthma with exacerbation Acute.  Suspect viral vs. Allergic cause of asthma exacerbation.  Examination unremarkable today - reassurance provided.  There are no signs of symptoms of secondary bacterial infection today.  She was treated with azithromycin, prednisone, Mucinex.  Okay to continue albuterol inhaler as needed for now.  Discussed that cough can last for up to 6 weeks after acute process.  Start Tessalon perles at night time to help with dry cough.  Continue supportive care.  Follow up if symptoms worsen or persist >6 weeks.  2. Acute cough    Follow up plan: Return if symptoms worsen or fail to improve.

## 2021-07-15 ENCOUNTER — Ambulatory Visit: Payer: Medicare HMO | Admitting: Nurse Practitioner

## 2021-10-29 DIAGNOSIS — J45909 Unspecified asthma, uncomplicated: Secondary | ICD-10-CM | POA: Diagnosis not present

## 2021-10-29 DIAGNOSIS — Z0000 Encounter for general adult medical examination without abnormal findings: Secondary | ICD-10-CM | POA: Diagnosis not present

## 2021-10-29 DIAGNOSIS — E559 Vitamin D deficiency, unspecified: Secondary | ICD-10-CM | POA: Diagnosis not present

## 2021-10-29 DIAGNOSIS — Z6836 Body mass index (BMI) 36.0-36.9, adult: Secondary | ICD-10-CM | POA: Diagnosis not present

## 2021-10-29 DIAGNOSIS — M8580 Other specified disorders of bone density and structure, unspecified site: Secondary | ICD-10-CM | POA: Diagnosis not present

## 2021-10-29 DIAGNOSIS — D259 Leiomyoma of uterus, unspecified: Secondary | ICD-10-CM | POA: Diagnosis not present

## 2021-10-29 DIAGNOSIS — Z79899 Other long term (current) drug therapy: Secondary | ICD-10-CM | POA: Diagnosis not present

## 2021-10-29 DIAGNOSIS — E669 Obesity, unspecified: Secondary | ICD-10-CM | POA: Diagnosis not present

## 2021-11-16 DIAGNOSIS — E669 Obesity, unspecified: Secondary | ICD-10-CM | POA: Diagnosis not present

## 2021-11-16 DIAGNOSIS — J45909 Unspecified asthma, uncomplicated: Secondary | ICD-10-CM | POA: Diagnosis not present

## 2021-11-16 DIAGNOSIS — E559 Vitamin D deficiency, unspecified: Secondary | ICD-10-CM | POA: Diagnosis not present

## 2021-11-16 DIAGNOSIS — Z79899 Other long term (current) drug therapy: Secondary | ICD-10-CM | POA: Diagnosis not present

## 2021-11-16 DIAGNOSIS — Z0001 Encounter for general adult medical examination with abnormal findings: Secondary | ICD-10-CM | POA: Diagnosis not present

## 2021-11-16 DIAGNOSIS — H938X3 Other specified disorders of ear, bilateral: Secondary | ICD-10-CM | POA: Diagnosis not present

## 2021-11-16 DIAGNOSIS — Z6836 Body mass index (BMI) 36.0-36.9, adult: Secondary | ICD-10-CM | POA: Diagnosis not present

## 2021-11-16 DIAGNOSIS — R7303 Prediabetes: Secondary | ICD-10-CM | POA: Diagnosis not present

## 2022-01-18 ENCOUNTER — Other Ambulatory Visit: Payer: Self-pay | Admitting: Student

## 2022-01-18 DIAGNOSIS — Z1231 Encounter for screening mammogram for malignant neoplasm of breast: Secondary | ICD-10-CM

## 2022-01-27 ENCOUNTER — Ambulatory Visit
Admission: RE | Admit: 2022-01-27 | Discharge: 2022-01-27 | Disposition: A | Discharge status: Home / Self Care | Payer: Medicare HMO | Source: Ambulatory Visit | Attending: Student | Admitting: Student

## 2022-01-27 DIAGNOSIS — Z1231 Encounter for screening mammogram for malignant neoplasm of breast: Secondary | ICD-10-CM

## 2022-06-06 ENCOUNTER — Other Ambulatory Visit: Payer: Self-pay | Admitting: Gastroenterology

## 2022-06-06 DIAGNOSIS — R1310 Dysphagia, unspecified: Secondary | ICD-10-CM

## 2022-10-25 ENCOUNTER — Encounter (HOSPITAL_COMMUNITY): Payer: Self-pay

## 2022-10-25 ENCOUNTER — Emergency Department (HOSPITAL_COMMUNITY)
Admission: EM | Admit: 2022-10-25 | Discharge: 2022-10-25 | Disposition: A | Discharge status: Home / Self Care | Payer: Medicare HMO | Attending: Emergency Medicine | Admitting: Emergency Medicine

## 2022-10-25 ENCOUNTER — Other Ambulatory Visit: Payer: Self-pay

## 2022-10-25 ENCOUNTER — Emergency Department (HOSPITAL_COMMUNITY): Payer: Medicare HMO

## 2022-10-25 DIAGNOSIS — M25562 Pain in left knee: Secondary | ICD-10-CM | POA: Diagnosis not present

## 2022-10-25 DIAGNOSIS — R21 Rash and other nonspecific skin eruption: Secondary | ICD-10-CM | POA: Diagnosis present

## 2022-10-25 DIAGNOSIS — M1712 Unilateral primary osteoarthritis, left knee: Secondary | ICD-10-CM | POA: Diagnosis not present

## 2022-10-25 DIAGNOSIS — B029 Zoster without complications: Secondary | ICD-10-CM | POA: Insufficient documentation

## 2022-10-25 DIAGNOSIS — W19XXXA Unspecified fall, initial encounter: Secondary | ICD-10-CM | POA: Insufficient documentation

## 2022-10-25 DIAGNOSIS — N898 Other specified noninflammatory disorders of vagina: Secondary | ICD-10-CM | POA: Insufficient documentation

## 2022-10-25 LAB — WET PREP, GENITAL
Clue Cells Wet Prep HPF POC: NONE SEEN
Sperm: NONE SEEN
Trich, Wet Prep: NONE SEEN
WBC, Wet Prep HPF POC: >=10 — AB (ref <10)
Yeast Wet Prep HPF POC: NONE SEEN

## 2022-10-25 LAB — HIV ANTIBODY (ROUTINE TESTING W REFLEX): HIV Screen 4th Generation wRfx: NONREACTIVE

## 2022-10-25 MED ORDER — ONDANSETRON 4 MG PO TBDP
4.0000 mg | ORAL_TABLET | Freq: Once | ORAL | Status: AC
  Administered 2022-10-25: 4 mg via ORAL
  Filled 2022-10-25: qty 1

## 2022-10-25 MED ORDER — ACETAMINOPHEN 500 MG PO TABS
1000.0000 mg | ORAL_TABLET | Freq: Once | ORAL | Status: AC
  Administered 2022-10-25: 1000 mg via ORAL
  Filled 2022-10-25: qty 2

## 2022-10-25 MED ORDER — FLUCONAZOLE 150 MG PO TABS: 150.0000 mg | ORAL_TABLET | Freq: Every day | ORAL | 0 refills | Status: DC

## 2022-10-25 MED ORDER — LIDOCAINE 5 % EX OINT: 1.0000 | TOPICAL_OINTMENT | CUTANEOUS | 1 refills | Status: DC | PRN

## 2022-10-25 MED ORDER — LIDOCAINE 4 % EX CREA
TOPICAL_CREAM | Freq: Once | CUTANEOUS | Status: AC
  Administered 2022-10-25: 1 via TOPICAL
  Filled 2022-10-25: qty 5

## 2022-10-25 MED ORDER — OXYCODONE HCL 5 MG PO TABS
5.0000 mg | ORAL_TABLET | Freq: Once | ORAL | Status: AC
  Administered 2022-10-25: 5 mg via ORAL
  Filled 2022-10-25: qty 1

## 2022-10-25 MED ORDER — OXYCODONE HCL 5 MG PO TABS: 5.0000 mg | ORAL_TABLET | ORAL | 0 refills | Status: DC | PRN

## 2022-10-25 NOTE — Discharge Instructions (Addendum)
Please continue to take your medications as prescribed. You can use the lidocaine as needed or can also try OTC spray lidocaine such as Dermoplast.  You are prescribed narcotic pain medication for your pain as well.  Please do not drive or operate heavy missionary while on this medication as it can make you sleepy.  Additionally, you are prescribed some fluconazole which will help with yeast.  Please follow-up with your gynecologist and/or PCP.  For your knee pain, I have included information for an orthopedic surgeon to the discharge paperwork.  Please make sure you call to schedule for an appointment.  We have given you a knee immobilizer.  You do not need to sleep or shower with this on however you can makes your symptoms better. I recommend using a walker.  If you have any concerns, new or worsening symptoms, please return to the nearest emergency department for evaluation.  Contact a health care provider if: Your pain is not relieved with prescribed medicines. Your pain does not get better after the rash heals. You have any of these signs of infection: More redness, swelling, or pain around the rash. Fluid or blood coming from the rash. Warmth coming from your rash. Pus or a bad smell coming from the rash. A fever. Get help right away if: The rash is on your face or nose. You have facial pain, pain around your eye area, or loss of feeling on one side of your face. You have difficulty seeing. You have ear pain or have ringing in your ear. You have a loss of taste. Your condition gets worse.

## 2022-10-25 NOTE — Care Management (Signed)
DME needed rolling walker, referral placed with [employer] liaison Jermaine, will follow up with this information tomorrow.

## 2022-10-25 NOTE — ED Provider Notes (Signed)
Physical Exam  BP 122/64   Pulse 61   Temp 98.8 F (37.1 C)   Resp 15   Ht 5\' 6"  (1.676 m)   Wt 107 kg   SpO2 98%   BMI 38.07 kg/m   Physical Exam Constitutional:      General: She is not in acute distress.    Appearance: She is not toxic-appearing.  Eyes:     General: No scleral icterus. Pulmonary:     Effort: Pulmonary effort is normal. No respiratory distress.  Musculoskeletal:        General: Tenderness present.     Comments: Tenderness to the more medial aspect of the left knee.  No overlying warmth or erythema.  Some swelling however very minimal.  Patient can flex and extend knee.  Palpable pulses.  Compartments are soft.  Temperature and coloration appear and feel symmetric bilaterally.  Neurological:     General: No focal deficit present.     Mental Status: She is alert. Mental status is at baseline.  Psychiatric:        Mood and Affect: Mood normal.     Procedures  Procedures  ED Course / MDM    Medical Decision Making Amount and/or Complexity of Data Reviewed Labs: ordered. Radiology: ordered.  Risk OTC drugs. Prescription drug management.   Accepted handoff at shift change from [facility], PA-C. Please see prior provider note for more detail.   Briefly: Patient is 67 y.o. F presenting for evaluation of herpes outbreak and knee pain after fall.   DDX: concern for knee trauma  Plan: follow up on XR of the knee  XR shows  1. No acute fracture or dislocation. 2. Mild patellofemoral and minimal lateral compartment osteoarthritis. 3. Probable 5 mm loose body overlying the lateral tibial spine.  Patient is a put in a soft knee brace and the patient however due to the size of her leg, the only 1 that would fit with a knee immobilizer.  We did give her some crutches for now and encouraged her to go to her local medical supply store or see does walking to get a walker.  They would like to follow-up as with her insurance.  I consult to [facility] for walker.   Patient has full flexion extension of her knee, only some tenderness to the more medial aspect.  Some swelling but no overlying warmth or erythema.  She has palpable pulses and soft compartments.  I will assess for any septic arthritis.  She is ambulatory, has more pain with straightening the leg.  I have a lower suspicion for any septic arthritis.  She has been ambulatory on the leg since the fall.  Will have her follow-up with orthopedic with the loose body seen.  I reviewed the labs with the patient.  Discussed with patient and has been that these labs would be pending for the next few days and to follow-up with her MyChart.  Asked for them to continue with the valacyclovir that she was given by her primary care doctor.  I prescribed him some topical lidocaine ointment and recommended Dermoplast lidocaine spray as well for the area.  I advised the patient to rest and allow the area to be open and dry.  We discussed return precautions and red flag symptoms.  They verbalized understanding and agreed to the plan.  Patient is stable being discharged home in good condition.  Results for orders placed or performed during the hospital encounter of 10/25/22  Wet prep, genital  Specimen: Vaginal  Result Value Ref Range   Yeast Wet Prep HPF POC NONE SEEN NONE SEEN   Trich, Wet Prep NONE SEEN NONE SEEN   Clue Cells Wet Prep HPF POC NONE SEEN NONE SEEN   WBC, Wet Prep HPF POC >=10 (A) <10   Sperm NONE SEEN    DG Knee Left Port  Result Date: 10/25/2022 CLINICAL DATA:  Pain and swelling of left knee.  Fell yesterday. EXAM: PORTABLE LEFT KNEE - 1-2 VIEW COMPARISON:  None Available. FINDINGS: Normal bone mineralization. Mild patellofemoral joint space narrowing and peripheral osteophytosis. No joint effusion. The medial and lateral compartment joint spaces are maintained. Minimal peripheral lateral compartment degenerative osteophytosis. A 5 mm ossicle overlies the lateral tibial spine, likely a loose body.  IMPRESSION: 1. No acute fracture or dislocation. 2. Mild patellofemoral and minimal lateral compartment osteoarthritis. 3. Probable 5 mm loose body overlying the lateral tibial spine. Electronically Signed   By: Neita Garnet M.D.   On: 10/25/2022 16:37         Achille Rich, PA-C 10/27/22 1056    Tegeler, Canary Brim, MD 10/27/22 1110

## 2022-10-25 NOTE — Progress Notes (Signed)
Orthopedic Tech Progress Note Patient Details:  Stephanie Wall 1955/10/18 [id number]  Ortho Devices Type of Ortho Device: Knee Immobilizer Ortho Device/Splint Location: LLE Ortho Device/Splint Interventions: Ordered, Application, Adjustment   Post Interventions Patient Tolerated: Well Instructions Provided: Care of device  [name] 10/25/2022, 6:40 PM

## 2022-10-25 NOTE — ED Provider Notes (Signed)
[facility]MENT AT [facility] Provider Note   CSN: [id number] Arrival date & time: 10/25/22  1123     History  Chief Complaint  Patient presents with   Herpes Zoster    Stephanie Wall is a 67 y.o. female presents emerged department chief complaint of rash.  Patient states that 1 week ago she had a few itchy bumps on her scalp.  These resolved.  Last Tuesday she was taking a bath and noticed some itching and irritation on the left side of her vulva.  She applied some Desitin cream.  She began having pain and swelling in that region the next day.  She saw her PCP who diagnosed her with herpes zoster and has been taking both acyclovir cream and valacyclovir since that time.  She has had vulvar swelling of the vulva.  She has no previous history of HSV 1 infection of the genitals.  She does have a history of cold sore on the lip.  She has also noticed some vaginal discharge.  She is in a monogamous relationship with her husband who is at bedside.  HPI     Home Medications Prior to Admission medications   Medication Sig Start Date End Date Taking? Authorizing Provider  albuterol (PROAIR HFA) 108 (90 Base) MCG/ACT inhaler Inhale 4 puffs into the lungs every 4 (four) hours as needed for wheezing or shortness of breath. 07/01/20   Salley Scarlet, MD  albuterol (PROVENTIL) (2.5 MG/3ML) 0.083% nebulizer solution Take 3 mLs (2.5 mg total) by nebulization every 6 (six) hours as needed for wheezing or shortness of breath. 03/29/21   Valentino Nose, NP  benzonatate (TESSALON) 100 MG capsule Take 1 capsule (100 mg total) by mouth 2 (two) times daily as needed for cough. 04/05/21   Valentino Nose, NP  CALCIUM PO Take 1,200 mg by mouth daily.    [provider]  cholecalciferol (VITAMIN D) 1000 units tablet Take 1,000 Units by mouth daily.    [provider]  EPINEPHrine 0.3 mg/0.3 mL IJ SOAJ injection Inject 0.3 mg into the muscle as needed for  anaphylaxis. 07/01/20   Salley Scarlet, MD  fluticasone (FLOVENT HFA) 110 MCG/ACT inhaler Inhale 2 puffs into the lungs 2 (two) times daily. 07/01/20   Salley Scarlet, MD  montelukast (SINGULAIR) 10 MG tablet TAKE 1 TABLET(10 MG) BY MOUTH AT BEDTIME 07/01/20   [place], Velna Hatchet, MD  omeprazole (PRILOSEC) 40 MG capsule TAKE 1 CAPSULE(40 MG) BY MOUTH DAILY 03/15/21   Napoleon Form, MD  Spacer/Aero-Holding Deretha Emory DEVI 1 each by Does not apply route daily. 04/05/21   Valentino Nose, NP      Allergies    Demerol, Dilaudid [hydromorphone hcl], and Morphine and codeine    Review of Systems   Review of Systems  Physical Exam Updated Vital Signs BP 132/68   Pulse 62   Temp 98.8 F (37.1 C)   Resp 15   Ht 5\' 6"  (1.676 m)   Wt 107 kg   SpO2 100%   BMI 38.07 kg/m  Physical Exam Vitals and nursing note reviewed. Exam conducted with a chaperone present.  Constitutional:      General: She is not in acute distress.    Appearance: She is well-developed. She is not diaphoretic.  HENT:     Head: Normocephalic and atraumatic.     Right Ear: External ear normal.     Left Ear: External ear normal.  Nose: Nose normal.     Mouth/Throat:     Mouth: Mucous membranes are moist.  Eyes:     General: No scleral icterus.    Conjunctiva/sclera: Conjunctivae normal.  Cardiovascular:     Rate and Rhythm: Normal rate and regular rhythm.     Heart sounds: Normal heart sounds. No murmur heard.    No friction rub. No gallop.  Pulmonary:     Effort: Pulmonary effort is normal. No respiratory distress.     Breath sounds: Normal breath sounds.  Abdominal:     General: Bowel sounds are normal. There is no distension.     Palpations: Abdomen is soft. There is no mass.     Tenderness: There is no abdominal tenderness. There is no guarding.  Genitourinary:    Comments: Pelvic examination done in the bed without access to stirrups.  Unable to visualize the cervix however the vagina appears pink  with minimal discharge, no bleeding or lesions noted. Exam chaperoned at bedside with nurse Corrie Dandy Regeis Musculoskeletal:     Cervical back: Normal range of motion.  Skin:    General: Skin is warm and dry.     Comments: A few singular lesions on which cross the midline on the scalp.  There is area of erythema with central crust.  No signs of secondary infection  The left labia majora and left perineum she has an area of multiple confluent small vesicular area of eruption with serous fluid.  Single vesicle on the left buttock region, no involvement of the labia minora,  Neurological:     Mental Status: She is alert and oriented to person, place, and time.  Psychiatric:        Behavior: Behavior normal.     ED Results / Procedures / Treatments   Labs (all labs ordered are listed, but only abnormal results are displayed) Labs Reviewed - No data to display  EKG None  Radiology No results found.  Procedures Procedures    Medications Ordered in ED Medications - No data to display  ED Course/ Medical Decision Making/ A&P                             Medical Decision Making Amount and/or Complexity of Data Reviewed Labs: ordered. Radiology: ordered.  Risk Prescription drug management.   67 year old female who presents with painful rash.  She has no previous history of HSV 1 and is low risk however has consented for viral cultures.  Her clinical appearance is certainly consistent with herpetic infection.  It is in a single dermatome so it is possible she has a S1 distribution of zoster. Patient labs wet prep without abnormality.  HSV 1 and 2 pending.  Left knee you with pending imaging due to fall yesterday.  She is ambulatory.  Small amount of bruising and swelling noted.  Signout given at shift handoff to PA [place].  Patient will need treatment with pain medication for burning pain in the region of the infection.  She is ready on treatment for zoster with Valtrex at home.  Will  also add oral flu can to treat her itching discomfort. PDMP reviewed during this encounter.         Final Clinical Impression(s) / ED Diagnoses Final diagnoses:  None    Rx / DC Orders ED Discharge Orders     None         Arthor Captain, PA-C 10/25/22 1721  Gerhard Munch, MD 11/01/22 [phone]

## 2022-10-25 NOTE — ED Triage Notes (Signed)
Pt states she has bumps on left side of vagina and it broke openx1wk. Pt states she went to PCP and the dr gave her cream and dx her with shingles. PT states it's painful to sit

## 2022-10-26 ENCOUNTER — Ambulatory Visit (INDEPENDENT_AMBULATORY_CARE_PROVIDER_SITE_OTHER): Payer: Medicare HMO | Admitting: Physician Assistant

## 2022-10-26 ENCOUNTER — Encounter: Payer: Self-pay | Admitting: Physician Assistant

## 2022-10-26 DIAGNOSIS — M25561 Pain in right knee: Secondary | ICD-10-CM | POA: Diagnosis not present

## 2022-10-26 DIAGNOSIS — M25562 Pain in left knee: Secondary | ICD-10-CM | POA: Diagnosis not present

## 2022-10-26 LAB — HSV 1/2 PCR (SURFACE)
HSV-1 DNA: NOT DETECTED
HSV-2 DNA: NOT DETECTED

## 2022-10-26 LAB — GC/CHLAMYDIA PROBE AMP (~~LOC~~) NOT AT ARMC
Chlamydia: NEGATIVE
Comment: NEGATIVE
Comment: NORMAL
Neisseria Gonorrhea: NEGATIVE

## 2022-10-26 NOTE — Progress Notes (Signed)
Office Visit Note   Patient: Stephanie Wall           Date of Birth: 08/30/1955           MRN: [id number] Visit Date: 10/26/2022              Requested by: No referring provider defined for this encounter. PCP: No primary care provider on file.   Assessment & Plan: Visit Diagnoses:  Left knee pain  Plan: Stephanie Wall is a pleasant 67 year old woman who is 24 hours status post falling with slight valgus stress onto her left knee.  She does have a history of some early arthritis in that knee.  She was seen in the emergency room yesterday.  X-rays did not show any fractures but question whether she had a loose body.  She said they did not have a hinged brace that was the appropriate size so they placed her in a knee immobilizer.  She rates her pain as moderate.  It is throughout her knee.  I am unsure whether the loose body is new or old.  Since this just happened yesterday I would like her to go through a period of getting a hinged knee brace icing elevating and taking meloxicam which she has at home.  Would like to reexamine her in 2 weeks if I have any concerns I could order an MRI she was given strict return precautions   Follow-Up Instructions: No follow-ups on file.   Orders:  No orders of the defined types were placed in this encounter.  No orders of the defined types were placed in this encounter.     Procedures: No procedures performed   Clinical Data: No additional findings.   Subjective: No chief complaint on file.   HPI patient is a pleasant 67 year old woman with a chief complaint of left knee pain x 24 hours.  She had a fall at the time and had a valgus fall onto the leg.  Does not recall a pop.  She was seen in the emergency room and given a knee immobilizer.  Review of Systems  All other systems reviewed and are negative.    Objective: Vital Signs: There were no vitals taken for this visit.  Physical Exam Constitutional:      Appearance: Normal  appearance.  Pulmonary:     Effort: Pulmonary effort is normal.  Neurological:     General: No focal deficit present.     Mental Status: She is alert.  Psychiatric:        Mood and Affect: Mood normal.        Behavior: Behavior normal.     Ortho Exam Left knee cannot appreciate any effusion she has no ecchymosis or erythema her compartments are soft and nontender she is able to sustain a straight leg raise and actively bend her leg.  Has good dorsiflexion and plantarflexion of her ankle.  No tenderness over the patellar or the quadriceps mechanism.  She does have tenderness over the MCL with slight more opening that I would expect.  Also tender over the medial lateral joint line and proximal tibia Specialty Comments:  No specialty comments available.  Imaging: DG Knee Left Port  Result Date: 10/25/2022 CLINICAL DATA:  Pain and swelling of left knee.  Fell yesterday. EXAM: PORTABLE LEFT KNEE - 1-2 VIEW COMPARISON:  None Available. FINDINGS: Normal bone mineralization. Mild patellofemoral joint space narrowing and peripheral osteophytosis. No joint effusion. The medial and lateral compartment joint spaces are maintained. Minimal  peripheral lateral compartment degenerative osteophytosis. A 5 mm ossicle overlies the lateral tibial spine, likely a loose body. IMPRESSION: 1. No acute fracture or dislocation. 2. Mild patellofemoral and minimal lateral compartment osteoarthritis. 3. Probable 5 mm loose body overlying the lateral tibial spine. Electronically Signed   By: Stephanie Wall M.D.   On: 10/25/2022 16:37     PMFS History: Patient Active Problem List   Diagnosis Date Noted   Pain in left knee 10/26/2022   Osteopenia 09/30/2019   Constipation, chronic 05/26/2015   Allergy-induced asthma 05/30/2014   Routine general medical examination at a health care facility 07/01/2013   Obesity (BMI 30-39.9) 07/01/2013   Allergic rhinitis 01/25/2009   FIBROIDS, UTERUS 01/21/2009   PAP SMEAR, ABNORMAL  01/21/2009   Past Medical History:  Diagnosis Date   Abnormal glandular Papanicolaou smear of cervix    ALLERGIC RHINITIS    Allergy    Arthritis    Asthma    allergy induced   Back pain    Cervicalgia    Fibroids    uterus   GERD (gastroesophageal reflux disease)    Obesity, unspecified    Snores    Wears glasses     Family History  Problem Relation Age of Onset   Hypertension Mother    Birth defects Mother    Allergic rhinitis Mother    Hypertension Father    Arthritis Maternal Grandmother    Cancer Maternal Grandmother 26       Breast Cancer   Hypertension Maternal Grandmother    Heart disease Maternal Grandmother    Diabetes Maternal Grandmother    Thyroid disease Maternal Grandmother    Heart disease Maternal Grandfather    Arthritis Paternal Grandmother    Kidney disease Paternal Grandmother    Hypertension Paternal Grandmother    Stroke Paternal Grandfather    HIV Sister    Heart disease Paternal Aunt        CHF   Asthma Neg Hx    Angioedema Neg Hx    Atopy Neg Hx    Eczema Neg Hx    Immunodeficiency Neg Hx    Urticaria Neg Hx    Colon cancer Neg Hx    Esophageal cancer Neg Hx    Pancreatic cancer Neg Hx    Rectal cancer Neg Hx    Stomach cancer Neg Hx     Past Surgical History:  Procedure Laterality Date   BREAST BIOPSY     BREAST CYST EXCISION  1984 and 1997   BREAST REDUCTION SURGERY Bilateral 07/16/2012   Procedure: BILATERAL BREAST REDUCTION  (BREAST);  Surgeon: Karie Fetch, MD;  Location: [facility];  Service: Plastics;  Laterality: Bilateral;   CHOLECYSTECTOMY  1976   COLONOSCOPY     REDUCTION MAMMAPLASTY     Social History   Occupational History   Occupation: unemployed    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

## 2022-11-09 ENCOUNTER — Ambulatory Visit (HOSPITAL_BASED_OUTPATIENT_CLINIC_OR_DEPARTMENT_OTHER): Payer: Medicare HMO | Admitting: Physician Assistant

## 2022-11-14 ENCOUNTER — Encounter (HOSPITAL_BASED_OUTPATIENT_CLINIC_OR_DEPARTMENT_OTHER): Payer: Self-pay | Admitting: Family

## 2022-11-14 ENCOUNTER — Ambulatory Visit (HOSPITAL_BASED_OUTPATIENT_CLINIC_OR_DEPARTMENT_OTHER): Payer: Medicare HMO | Admitting: Family

## 2022-11-14 VITALS — BP 114/68 | HR 60 | Ht 66.0 in | Wt 233.0 lb

## 2022-11-14 DIAGNOSIS — G4730 Sleep apnea, unspecified: Secondary | ICD-10-CM | POA: Diagnosis not present

## 2022-11-14 DIAGNOSIS — R0683 Snoring: Secondary | ICD-10-CM | POA: Diagnosis not present

## 2022-11-14 DIAGNOSIS — R0609 Other forms of dyspnea: Secondary | ICD-10-CM

## 2022-11-14 DIAGNOSIS — R002 Palpitations: Secondary | ICD-10-CM

## 2022-11-14 DIAGNOSIS — R600 Localized edema: Secondary | ICD-10-CM

## 2022-11-14 MED ORDER — FUROSEMIDE 20 MG PO TABS
20.0000 mg | ORAL_TABLET | Freq: Every day | ORAL | 3 refills | Status: AC
Start: 2022-11-14 — End: 2023-02-12

## 2022-11-14 NOTE — Progress Notes (Addendum)
  Cardiology Office Note:  .   Date:  11/18/2022  ID:  Stephanie Wall, DOB 1955-11-10, MRN [id number] PCP: Sarita Bottom, NP  [facility] Providers Cardiologist:  None    History of Present Illness: [name]   Stephanie Wall is a 67 y.o. female with history of GERD, obesity, arthritis, asthma, palpitations.  Last seen 03/08/2021.  Initially seen 03/08/2021 with palpitations, atypical chest pain post-COVID which had resolved by the time she was evaluated.  Was recommended follow-up as needed.  ED visit 10/25/2022 with rash diagnosed as herpes zoster by PCP and knee pain after a fall.  Presents today for follow-up independently.  Notes she had echocardiogram with her PCP due to swelling and was told her heart was slightly enlarged on the right ventricle.  Report unavailable for my review.  Notes her left leg "stays swollen". It is worse at the end of the day. Endorses palpitations sensation of heart racing. Notes heart will speed up 2-3 times per month associated with lightheadedness but lasting only a few minutes. Has decreased her caffeine intake. Does note she does likely drink enough water. Reports no recent stressors.   She uses her inhaler intermittently when she needs it. Notes some exertional dyspnea which has been ongoing for more than a few years. It is unchanged on previous. No orthopnea, PND  She does snore. Prior sleep study 20 years ago with no sleep apnea. She does often need a nap and wakes up feeling tired.   ROS: Please see the history of present illness.    All other systems reviewed and are negative.   Studies Reviewed: [name]   EKG Interpretation Date/Time:  Monday November 14 2022 08:27:18 EDT Ventricular Rate:  60 PR Interval:  190 QRS Duration:  100 QT Interval:  380 QTC Calculation: 380 R Axis:   -14  Text Interpretation: Normal sinus rhythm Incomplete right bundle branch block - stable from previous Confirmed by Gillian Shields ([id number]) on 11/14/2022 8:50:28 AM         Risk Assessment/Calculations:        STOP-Bang Score:  6      Physical Exam:   VS:  BP 114/68   Pulse 60   Ht 5\' 6"  (1.676 m)   Wt 233 lb (105.7 kg)   BMI 37.61 kg/m    Wt Readings from Last 3 Encounters:  11/14/22 233 lb (105.7 kg)  10/25/22 235 lb 14.3 oz (107 kg)  04/05/21 236 lb (107 kg)    GEN: Well nourished, overweight, well developed in no acute distress NECK: No JVD; No carotid bruits CARDIAC: RRR, no murmurs, rubs, gallops RESPIRATORY:  Clear to auscultation without rales, wheezing or rhonchi  ABDOMEN: Soft, non-tender, non-distended EXTREMITIES:  No edema; No deformity   ASSESSMENT AND PLAN: .    Lower extremity edema / Exertional dyspnea- Echo 04/2022 at [facility] per her report mildly reduced RV function, will request report.  Start furosemide 20 mg 3 times per week.  BMP, BNP today and repeat BMP in 2 weeks. Leg elevation, compression stockings recommended.  Palpitations - intermittent palpitations with sensation of heart racing. Recommend lifestyle changes: increasing hydration, avoiding caffeine.  If persistent or more bothersome at follow-up consider ZIO monitor.  Snores/Sleep disordered breathing-note snoring and daytime somnolence.  STOP-BANG 6.  Home sleep study ordered.       Dispo: follow up in 4-6 weeks  Signed, Alver Sorrow, NP

## 2022-11-14 NOTE — Patient Instructions (Addendum)
Medication Instructions:  Your physician has recommended you make the following change in your medication:   START Furosemide (Lasix) 20mg  three times per week  Take on Mondays, Wednesday, Fridays or Tuesdays, Thursdays, Friday  *If you need a refill on your cardiac medications before your next appointment, please call your pharmacy*   Lab Work: Your physician recommends that you return for lab work today: BMP, BNP  Your physician recommends that you return for lab work in 1-2 weeks: BMP  If you have labs (blood work) drawn today and your tests are completely normal, you will receive your results only by: [employer] (if you have MyChart) OR A paper copy in the mail If you have any lab test that is abnormal or we need to change your treatment, we will call you to review the results.   Testing/Procedures: Your provider recommends a home sleep study.   Follow-Up: At [facility], you and your health needs are our priority.  As part of our continuing mission to provide you with exceptional heart care, we have created designated Provider Care Teams.  These Care Teams include your primary Cardiologist (physician) and Advanced Practice Providers (APPs -  Physician Assistants and Nurse Practitioners) who all work together to provide you with the care you need, when you need it.  We recommend signing up for the patient portal called "MyChart".  Sign up information is provided on this After Visit Summary.  MyChart is used to connect with patients for Virtual Visits (Telemedicine).  Patients are able to view lab/test results, encounter notes, upcoming appointments, etc.  Non-urgent messages can be sent to your provider as well.   To learn more about what you can do with MyChart, go to [email or web address].    Your next appointment:   4-6 week(s)  Provider:   Jodelle Red, MD or Gillian Shields, NP    Other Instructions  To prevent palpitations: Make sure you are  adequately hydrated.  Avoid and/or limit caffeine containing beverages like soda or tea. Exercise regularly.  Manage stress well. Some over the counter medications can cause palpitations such as Benadryl, AdvilPM, TylenolPM. Regular Advil or Tylenol do not cause palpitations.   To prevent or reduce lower extremity swelling: Eat a low salt diet. Salt makes the body hold onto extra fluid which causes swelling. Sit with legs elevated. For example, in the recliner or on an ottoman.  Wear knee-high compression stockings during the daytime. Ones labeled 15-20 mmHg provide good compression.

## 2022-11-15 LAB — BASIC METABOLIC PANEL
BUN/Creatinine Ratio: 14 (ref 12–28)
BUN: 14 mg/dL (ref 8–27)
CO2: 24 mmol/L (ref 20–29)
Calcium: 10.8 mg/dL — ABNORMAL HIGH (ref 8.7–10.3)
Chloride: 105 mmol/L (ref 96–106)
Creatinine, Ser: 0.97 mg/dL (ref 0.57–1.00)
Glucose: 96 mg/dL (ref 70–99)
Potassium: 4.4 mmol/L (ref 3.5–5.2)
Sodium: 143 mmol/L (ref 134–144)
eGFR: 63 mL/min/{1.73_m2} (ref >59)

## 2022-11-15 LAB — BRAIN NATRIURETIC PEPTIDE: BNP: 9.8 pg/mL (ref 0.0–100.0)

## 2022-11-16 ENCOUNTER — Ambulatory Visit (INDEPENDENT_AMBULATORY_CARE_PROVIDER_SITE_OTHER): Payer: Medicare HMO | Admitting: Physician Assistant

## 2022-11-16 ENCOUNTER — Encounter: Payer: Self-pay | Admitting: Physician Assistant

## 2022-11-16 DIAGNOSIS — M25561 Pain in right knee: Secondary | ICD-10-CM

## 2022-11-16 NOTE — Progress Notes (Signed)
Office Visit Note   Patient: Stephanie Wall           Date of Birth: 1956/01/25           MRN: [id number] Visit Date: 11/16/2022              Requested by: No referring provider defined for this encounter. PCP: Sarita Bottom, NP  Chief Complaint  Patient presents with   Right Knee - Follow-up      HPI: Stephanie Wall is a pleasant 67 year old woman who I been following for right knee pain.  She first saw me about 3 weeks ago.  She had a fall down some stairs with a flexion valgus injury.  At that time findings were consistent with MCL strain as well as a bony contusion.  She reports she is doing much better she has been using her hinged knee brace.  Pain has significantly reduced she can now rise out of the chair much better.  Still having little pain rates it is mild to moderate  Assessment & Plan: Visit Diagnoses: Right knee pain  Plan: Had a long discussion with her.  I am pleased that she is doing better.  Would defer an MRI for now.  She may wean out of the brace that she feels comfortable.  Will follow-up with her in 1 month sooner if she has recurring symptoms  Follow-Up Instructions: No follow-ups on file.   Ortho Exam  Patient is alert, oriented, no adenopathy, well-dressed, normal affect, normal respiratory effort. Examination of her right knee no effusion no erythema compartments are soft and compressible she has good activation of her quad and patella no pain she has some opening with valgus stress.  Little pain over the medial side over the medial femoral condyle with valgus stress.  She is neurovascular intact  Imaging: No results found. No images are attached to the encounter.  Labs: No results found for: "HGBA1C", "ESRSEDRATE", "CRP", "LABURIC", "REPTSTATUS", "GRAMSTAIN", "CULT", "LABORGA"   Lab Results  Component Value Date   ALBUMIN 4.0 06/24/2016   ALBUMIN 3.7 05/06/2016   ALBUMIN 3.8 06/08/2015    No results found for: "MG" Lab Results  Component  Value Date   VD25OH 28 (L) 01/15/2021   VD25OH 28 (L) 07/01/2020   VD25OH 35 06/17/2019    No results found for: "PREALBUMIN"    Latest Ref Rng & Units 01/15/2021   11:32 AM 07/01/2020    8:23 AM 06/17/2019    8:24 AM  CBC EXTENDED  WBC 3.8 - 10.8 Thousand/uL 5.2  5.0  4.5   RBC 3.80 - 5.10 Million/uL 4.27  4.33  4.06   Hemoglobin 11.7 - 15.5 g/dL 40.9  81.1  91.4   HCT 35.0 - 45.0 % 39.7  39.5  37.3   Platelets 140 - 400 Thousand/uL 213  242  206   NEUT# 1,500 - 7,800 cells/uL 2,865  2,450  2,201   Lymph# 850 - 3,900 cells/uL 1,794  1,975  1,832      There is no height or weight on file to calculate BMI.  Orders:  No orders of the defined types were placed in this encounter.  No orders of the defined types were placed in this encounter.    Procedures: No procedures performed  Clinical Data: No additional findings.  ROS:  All other systems negative, except as noted in the HPI. Review of Systems  Objective: Vital Signs: There were no vitals taken for this visit.  Specialty Comments:  No specialty comments available.  PMFS History: Patient Active Problem List   Diagnosis Date Noted   Pain in left knee 10/26/2022   Osteopenia 09/30/2019   Constipation, chronic 05/26/2015   Allergy-induced asthma 05/30/2014   Routine general medical examination at a health care facility 07/01/2013   Obesity (BMI 30-39.9) 07/01/2013   Allergic rhinitis 01/25/2009   FIBROIDS, UTERUS 01/21/2009   PAP SMEAR, ABNORMAL 01/21/2009   Past Medical History:  Diagnosis Date   Abnormal glandular Papanicolaou smear of cervix    ALLERGIC RHINITIS    Allergy    Arthritis    Asthma    allergy induced   Back pain    Cervicalgia    Fibroids    uterus   GERD (gastroesophageal reflux disease)    Obesity, unspecified    Snores    Wears glasses     Family History  Problem Relation Age of Onset   Hypertension Mother    Birth defects Mother    Allergic rhinitis Mother     Hypertension Father    Arthritis Maternal Grandmother    Cancer Maternal Grandmother 36       Breast Cancer   Hypertension Maternal Grandmother    Heart disease Maternal Grandmother    Diabetes Maternal Grandmother    Thyroid disease Maternal Grandmother    Heart disease Maternal Grandfather    Arthritis Paternal Grandmother    Kidney disease Paternal Grandmother    Hypertension Paternal Grandmother    Stroke Paternal Grandfather    HIV Sister    Heart disease Paternal Aunt        CHF   Asthma Neg Hx    Angioedema Neg Hx    Atopy Neg Hx    Eczema Neg Hx    Immunodeficiency Neg Hx    Urticaria Neg Hx    Colon cancer Neg Hx    Esophageal cancer Neg Hx    Pancreatic cancer Neg Hx    Rectal cancer Neg Hx    Stomach cancer Neg Hx     Past Surgical History:  Procedure Laterality Date   BREAST BIOPSY     BREAST CYST EXCISION  1984 and 1997   BREAST REDUCTION SURGERY Bilateral 07/16/2012   Procedure: BILATERAL BREAST REDUCTION  (BREAST);  Surgeon: Karie Fetch, MD;  Location:  [facility];  Service: Plastics;  Laterality: Bilateral;   CHOLECYSTECTOMY  1976   COLONOSCOPY     REDUCTION MAMMAPLASTY     Social History   Occupational History   Occupation: unemployed    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

## 2022-11-18 ENCOUNTER — Encounter (HOSPITAL_BASED_OUTPATIENT_CLINIC_OR_DEPARTMENT_OTHER): Payer: Self-pay | Admitting: Family

## 2022-11-18 ENCOUNTER — Telehealth (HOSPITAL_BASED_OUTPATIENT_CLINIC_OR_DEPARTMENT_OTHER): Payer: Self-pay | Admitting: Family

## 2022-11-18 ENCOUNTER — Encounter (HOSPITAL_BASED_OUTPATIENT_CLINIC_OR_DEPARTMENT_OTHER): Payer: Self-pay

## 2022-11-18 NOTE — Telephone Encounter (Signed)
Echo report received from [facility] dated 05/10/2022  LV: EF 55 to 60%, normal size, normal wall thickness, indeterminate diastolic filling pattern Bilateral atria normal in size RV: Mildly dilated, mildly reduced RV function Aortic valve: Trileaflet valve with no regurgitation, mild aortic valve leaflet thickening, no evidence of aortic valve stenosis. Mitral valve: Mild MR, mild MV leaflet thickening Tricuspid valve (structurally normal tricuspid valve with mild regurgitation RVSP is likely underestimated due to incomplete TR envelope Aortic root: Aortic root is normal  Stephanie Sorrow, NP

## 2022-12-19 ENCOUNTER — Ambulatory Visit: Payer: Medicare HMO | Admitting: Physician Assistant

## 2023-01-24 ENCOUNTER — Ambulatory Visit (HOSPITAL_BASED_OUTPATIENT_CLINIC_OR_DEPARTMENT_OTHER): Payer: Medicare HMO | Admitting: Family

## 2023-01-24 ENCOUNTER — Encounter (HOSPITAL_BASED_OUTPATIENT_CLINIC_OR_DEPARTMENT_OTHER): Payer: Self-pay | Admitting: Family

## 2023-01-24 VITALS — BP 112/74 | HR 69 | Ht 66.0 in | Wt 229.8 lb

## 2023-01-24 DIAGNOSIS — R600 Localized edema: Secondary | ICD-10-CM | POA: Diagnosis not present

## 2023-01-24 DIAGNOSIS — R0609 Other forms of dyspnea: Secondary | ICD-10-CM | POA: Diagnosis not present

## 2023-01-24 DIAGNOSIS — R002 Palpitations: Secondary | ICD-10-CM | POA: Diagnosis not present

## 2023-01-24 LAB — BASIC METABOLIC PANEL
BUN/Creatinine Ratio: 11 — ABNORMAL LOW (ref 12–28)
BUN: 10 mg/dL (ref 8–27)
CO2: 21 mmol/L (ref 20–29)
Calcium: 9.6 mg/dL (ref 8.7–10.3)
Chloride: 105 mmol/L (ref 96–106)
Creatinine, Ser: 0.95 mg/dL (ref 0.57–1.00)
Glucose: 94 mg/dL (ref 70–99)
Potassium: 3.9 mmol/L (ref 3.5–5.2)
Sodium: 141 mmol/L (ref 134–144)
eGFR: 65 mL/min/{1.73_m2} (ref >59)

## 2023-01-24 MED ORDER — FUROSEMIDE 20 MG PO TABS
20.0000 mg | ORAL_TABLET | ORAL | 3 refills | Status: DC
Start: 2023-01-25 — End: 2023-06-26

## 2023-01-24 NOTE — Patient Instructions (Addendum)
Medication Instructions:  Continue your current medications.   *If you need a refill on your cardiac medications before your next appointment, please call your pharmacy*   Lab Work: Your physician recommends that you return for lab work today: BMP  If you have labs (blood work) drawn today and your tests are completely normal, you will receive your results only by: MyChart Message (if you have MyChart) OR A paper copy in the mail If you have any lab test that is abnormal or we need to change your treatment, we will call you to review the results.   Testing/Procedures: [facility] will contact you to schedule sleep study at home   Follow-Up: At [facility], you and your health needs are our priority.  As part of our continuing mission to provide you with exceptional heart care, we have created designated Provider Care Teams.  These Care Teams include your primary Cardiologist (physician) and Advanced Practice Providers (APPs -  Physician Assistants and Nurse Practitioners) who all work together to provide you with the care you need, when you need it.  We recommend signing up for the patient portal called "MyChart".  Sign up information is provided on this After Visit Summary.  MyChart is used to connect with patients for Virtual Visits (Telemedicine).  Patients are able to view lab/test results, encounter notes, upcoming appointments, etc.  Non-urgent messages can be sent to your provider as well.   To learn more about what you can do with MyChart, go to [email or web address].    Your next appointment:   In March as scheduled with Dr. Cristal Deer  Other Instructions  To prevent or reduce lower extremity swelling: Eat a low salt diet. Salt makes the body hold onto extra fluid which causes swelling. Sit with legs elevated. For example, in the recliner or on an ottoman.  Wear knee-high compression stockings during the daytime. Ones labeled 15-20 mmHg provide good  compression.

## 2023-01-24 NOTE — Progress Notes (Signed)
Cardiology Office Note:  .   Date:  01/24/2023  ID:  [name], DOB Jan 20, 1956, MRN [id number] PCP: Sarita Bottom, NP  [facility] Providers Cardiologist:  Jodelle Red, MD    History of Present Illness: Stephanie Wall   Stephanie Wall is a 67 y.o. female  with history of GERD, obesity, arthritis, asthma, palpitations.     Initially seen 03/08/2021 with palpitations, atypical chest pain post-COVID which had resolved by the time she was evaluated.  Was recommended follow-up as needed.   ED visit 10/25/2022 with rash diagnosed as herpes zoster by PCP and knee pain after a fall.  Last seen 11/14/2022 after echo by her PCP revealed slightly enlarged right ventricle and mildly reduced RV SF.  LVEF 55 to 60%, mild MR.  Due to persistent lower extremity edema furosemide 20 mg 23 times per week.  Due to snoring, daytime somnolence home sleep study was ordered.  For palpitations she was recommended to increase hydration, avoid caffeine.  Presents today for follow up. LE edema improved since addition of Lasix 3x per week. Has been taking intermittently recently due to taking her father to multiple appointments as he has been recently diagnosed with esophageal cancer. Condolences offered. Reports no shortness of breath nor dyspnea on exertion. Reports no chest pain, pressure, or tightness. No  orthopnea, PND. Reports no palpitations.     ROS: Please see the history of present illness.    All other systems reviewed and are negative.   Studies Reviewed: .        Echo [facility] dated 05/10/2022   LV: EF 55 to 60%, normal size, normal wall thickness, indeterminate diastolic filling pattern Bilateral atria normal in size RV: Mildly dilated, mildly reduced RV function Aortic valve: Trileaflet valve with no regurgitation, mild aortic valve leaflet thickening, no evidence of aortic valve stenosis. Mitral valve: Mild MR, mild MV leaflet thickening Tricuspid valve (structurally normal  tricuspid valve with mild regurgitation RVSP is likely underestimated due to incomplete TR envelope Aortic root: Aortic root is normal  Risk Assessment/Calculations:         STOP-Bang Score:  6      Physical Exam:   VS:  BP 112/74   Pulse 69   Ht 5\' 6"  (1.676 m)   Wt 229 lb 12.8 oz (104.2 kg)   SpO2 99%   BMI 37.09 kg/m    Wt Readings from Last 3 Encounters:  01/24/23 229 lb 12.8 oz (104.2 kg)  11/14/22 233 lb (105.7 kg)  10/25/22 235 lb 14.3 oz (107 kg)    GEN: Well nourished, well developed in no acute distress NECK: No JVD; No carotid bruits CARDIAC: RRR, no murmurs, rubs, gallops RESPIRATORY:  Clear to auscultation without rales, wheezing or rhonchi  ABDOMEN: Soft, non-tender, non-distended EXTREMITIES:  Non pitting LLE edema; No deformity   ASSESSMENT AND PLAN: .    Lower extremity edema / Exertional dyspnea- Echo 04/2022 at [facility] normal LVEF, mildly reduced RVSF. Continue furosemide 20 mg 3 times per week.  BMP today for monitoring of renal function.  Leg elevation, compression stockings recommended.   Palpitations - Quiescent not requiring medical therapy. Continue lifestyle changes: increasing hydration, avoiding caffeine.  If persistent or more bothersome at follow-up consider ZIO monitor.   Snores/Sleep disordered breathing-note snoring and daytime somnolence.  STOP-BANG 6.  Home sleep study previously ordered.  Awaiting scheduling.          Dispo: follow up in 6 months  Signed, Alver Sorrow, NP

## 2023-02-17 ENCOUNTER — Other Ambulatory Visit: Payer: Self-pay | Admitting: Nurse Practitioner

## 2023-02-17 DIAGNOSIS — Z1231 Encounter for screening mammogram for malignant neoplasm of breast: Secondary | ICD-10-CM

## 2023-03-21 ENCOUNTER — Ambulatory Visit
Admission: RE | Admit: 2023-03-21 | Discharge: 2023-03-21 | Disposition: A | Discharge status: Home / Self Care | Payer: Medicare HMO | Source: Ambulatory Visit | Attending: Nurse Practitioner | Admitting: Nurse Practitioner

## 2023-03-21 DIAGNOSIS — Z1231 Encounter for screening mammogram for malignant neoplasm of breast: Secondary | ICD-10-CM

## 2023-04-10 ENCOUNTER — Ambulatory Visit
Admission: RE | Admit: 2023-04-10 | Discharge: 2023-04-10 | Disposition: A | Discharge status: Home / Self Care | Payer: Medicare HMO | Source: Ambulatory Visit | Attending: Student

## 2023-04-10 ENCOUNTER — Other Ambulatory Visit: Payer: Self-pay | Admitting: Student

## 2023-04-10 DIAGNOSIS — R053 Chronic cough: Secondary | ICD-10-CM

## 2023-06-26 ENCOUNTER — Encounter (HOSPITAL_BASED_OUTPATIENT_CLINIC_OR_DEPARTMENT_OTHER): Payer: Self-pay | Admitting: Family

## 2023-06-26 ENCOUNTER — Ambulatory Visit (HOSPITAL_BASED_OUTPATIENT_CLINIC_OR_DEPARTMENT_OTHER): Admitting: Family

## 2023-06-26 VITALS — BP 132/74 | HR 61 | Ht 66.0 in | Wt 234.4 lb

## 2023-06-26 DIAGNOSIS — R0683 Snoring: Secondary | ICD-10-CM | POA: Diagnosis not present

## 2023-06-26 DIAGNOSIS — R600 Localized edema: Secondary | ICD-10-CM | POA: Diagnosis not present

## 2023-06-26 DIAGNOSIS — R002 Palpitations: Secondary | ICD-10-CM

## 2023-06-26 DIAGNOSIS — R400 Somnolence: Secondary | ICD-10-CM

## 2023-06-26 MED ORDER — EPINEPHRINE 0.3 MG/0.3ML IJ SOAJ: 0.3000 mg | INTRAMUSCULAR | 2 refills | Status: DC | PRN

## 2023-06-26 MED ORDER — METOPROLOL TARTRATE 25 MG PO TABS: 25.0000 mg | ORAL_TABLET | Freq: Two times a day (BID) | ORAL | 1 refills | Status: DC | PRN

## 2023-06-26 MED ORDER — FUROSEMIDE 20 MG PO TABS
ORAL_TABLET | ORAL | 3 refills | Status: AC
Start: 2023-06-26 — End: ?

## 2023-06-26 NOTE — Patient Instructions (Addendum)
 Medication Instructions:  Your physician has recommended you make the following change in your medication:   Start: metoprolol tartrate 25mg  as needed for swelling   Start lasix 20mg  on Monday and Thursday   We have refilled your epipen.    Follow-Up: At [facility], you and your health needs are our priority.  As part of our continuing mission to provide you with exceptional heart care, we have created designated Provider Care Teams.  These Care Teams include your primary Cardiologist (physician) and Advanced Practice Providers (APPs -  Physician Assistants and Nurse Practitioners) who all work together to provide you with the care you need, when you need it.  We recommend signing up for the patient portal called "MyChart".  Sign up information is provided on this After Visit Summary.  MyChart is used to connect with patients for Virtual Visits (Telemedicine).  Patients are able to view lab/test results, encounter notes, upcoming appointments, etc.  Non-urgent messages can be sent to your provider as well.   To learn more about what you can do with MyChart, go to [email or web address].    Your next appointment:   6 month(s)  Provider:   Jodelle Red, MD, Eligha Bridegroom, NP, or Gillian Shields, NP    Other Instructions To prevent palpitations: Make sure you are adequately hydrated.  Avoid and/or limit caffeine containing beverages like soda or tea. Exercise regularly.  Manage stress well. Some over the counter medications can cause palpitations such as Benadryl, AdvilPM, TylenolPM. Regular Advil or Tylenol do not cause palpitations.

## 2023-06-26 NOTE — Progress Notes (Addendum)
 Cardiology Office Note:  .   Date:  06/26/2023  ID:  LASTACIA SOLUM, DOB 03-21-1956, MRN [id number] PCP: Nils Baseman, NP  [facility] Providers Cardiologist:  Sheryle Donning, MD    History of Present Illness: Aaron Aas   AZLYN WINGLER is a 68 y.o. female  with history of GERD, obesity, arthritis, asthma, palpitations.     Initially seen 03/08/2021 with palpitations, atypical chest pain post-COVID which had resolved by the time she was evaluated.  Was recommended follow-up as needed.   ED visit 10/25/2022 with rash diagnosed as herpes zoster by PCP and knee pain after a fall.  Last seen 11/14/2022 after echo by her PCP revealed slightly enlarged right ventricle and mildly reduced RV SF.  LVEF 55 to 60%, mild MR.  Due to persistent lower extremity edema furosemide  20 mg initiated 3 times per week.  Due to snoring, daytime somnolence home sleep study was ordered.  For palpitations she was recommended to increase hydration, avoid caffeine. Last seen 01/2023 with LE edema improved, palpitations quiescent.   Presents today for follow up. Since last seen her father passed away from esophageal cancer in October. Notes in November she had recurrence of palpitations predominantly after activity when she would sit down and rest. She self-discontinued her Lasix  as perceived it as causing side effects her palpitations. Since stopping Lasix , palpitations have stopped but LE edema worsened. Notes during time of palpitations they would occur whether she did or did not take Furosemide . No significant lightheadedness, dizziness. No chest apin, pressure, tightness. No orthopnea, PND.   ROS: Please see the history of present illness.    All other systems reviewed and are negative.   Studies Reviewed: .        Echo [facility] dated 05/10/2022   LV: EF 55 to 60%, normal size, normal wall thickness, indeterminate diastolic filling pattern Bilateral atria normal in size RV: Mildly dilated,  mildly reduced RV function Aortic valve: Trileaflet valve with no regurgitation, mild aortic valve leaflet thickening, no evidence of aortic valve stenosis. Mitral valve: Mild MR, mild MV leaflet thickening Tricuspid valve (structurally normal tricuspid valve with mild regurgitation RVSP is likely underestimated due to incomplete TR envelope Aortic root: Aortic root is normal  Risk Assessment/Calculations:         STOP-Bang Score:  6      Physical Exam:   VS:  BP 132/74   Pulse 61   Ht 5\' 6"  (1.676 m)   Wt 234 lb 6.4 oz (106.3 kg)   SpO2 99%   BMI 37.83 kg/m    Wt Readings from Last 3 Encounters:  06/26/23 234 lb 6.4 oz (106.3 kg)  01/24/23 229 lb 12.8 oz (104.2 kg)  11/14/22 233 lb (105.7 kg)    GEN: Well nourished, well developed in no acute distress NECK: No JVD; No carotid bruits CARDIAC: RRR, no murmurs, rubs, gallops RESPIRATORY:  Clear to auscultation without rales, wheezing or rhonchi  ABDOMEN: Soft, non-tender, non-distended EXTREMITIES:  Non pitting bilateral LE edema; No deformity   ASSESSMENT AND PLAN: .    Lower extremity edema - Echo 04/2022 at [facility] normal LVEF, mildly reduced RVSF.Anticipate venous insufficiency also contributory. Self discontinued Lasix  a few months ago due to palpitations which were unlikely related as would have on days she did and did not take Lasix . Discussed Lasix  a few times per week vs daily Spironolactone. Prefers to proceed with Lasix  twice per week. If edema does not improve, consider  alternate diuretic such as Torsemide or Bumex or referral to [facility].  Leg elevation, compression stockings 15-39mmHg recommended.   Palpitations - Recurrence in November in setting of stress of losing her father and poor PO intake. Improved with hydration. Rx metoprolol  tartrate 25mg  PRN for palpitations.    Snores/Sleep disordered breathing-note snoring and daytime somnolence suggestive of sleep apnea.  STOP-BANG 6.  Home sleep study previously  ordered.  Awaiting scheduling. Will route to sleep coordinator for follow up as was ordered in July 2024.           Dispo: Phone call to check in on LE edema, palpitations in 2 weeks. Follow up in 6 months  Signed, Clearnce Curia, NP

## 2023-06-27 ENCOUNTER — Telehealth: Payer: Self-pay

## 2023-06-27 ENCOUNTER — Telehealth: Payer: Self-pay | Admitting: Family

## 2023-06-27 NOTE — Telephone Encounter (Addendum)
**Note De-Identified Murry Khiev Obfuscation** Per [employer] provider Portal: Home Sleep Study PA is Pending: In clinical review Tracking [id number]

## 2023-06-27 NOTE — Telephone Encounter (Signed)
 Pt c/o medication issue:  1. Name of Medication:   metoprolol tartrate (LOPRESSOR) 25 MG tablet    2. How are you currently taking this medication (dosage and times per day)? As written   3. Are you having a reaction (difficulty breathing--STAT)? No   4. What is your medication issue? Pt spouse called with pt asking for clarification on the instructions for this medications. Please advise.

## 2023-06-27 NOTE — Telephone Encounter (Signed)
 Returned call to husband (ok per DPR), answered questions. Understanding verbalized.

## 2023-07-03 NOTE — Telephone Encounter (Signed)
**Note De-Identified Mao Lockner Obfuscation** Per the [facility] Provider Portal, this Home Sleep Study PA has been approved from 07/24/2023-10/23/2023. Auth #:[id number]   I have transferred the Home Sleep Test order to the sleep lab so they can contact the pt to schedule test.

## 2023-07-07 ENCOUNTER — Ambulatory Visit (HOSPITAL_BASED_OUTPATIENT_CLINIC_OR_DEPARTMENT_OTHER): Payer: Medicare HMO | Admitting: Cardiology

## 2023-07-10 ENCOUNTER — Encounter (HOSPITAL_BASED_OUTPATIENT_CLINIC_OR_DEPARTMENT_OTHER): Payer: Self-pay

## 2023-07-13 NOTE — Telephone Encounter (Signed)
 Spoke with patient to make her aware that the HST has been authorized by her insurance and to provide the Sleep Lab number for scheduling. Pt verbalized understanding and appreciation of call.

## 2023-08-03 ENCOUNTER — Ambulatory Visit (HOSPITAL_BASED_OUTPATIENT_CLINIC_OR_DEPARTMENT_OTHER): Payer: Medicare HMO | Admitting: Nurse Practitioner

## 2023-08-30 ENCOUNTER — Ambulatory Visit (HOSPITAL_BASED_OUTPATIENT_CLINIC_OR_DEPARTMENT_OTHER): Attending: Family | Admitting: Cardiovascular Disease

## 2023-08-30 DIAGNOSIS — R400 Somnolence: Secondary | ICD-10-CM | POA: Diagnosis not present

## 2023-08-30 DIAGNOSIS — R0683 Snoring: Secondary | ICD-10-CM | POA: Diagnosis present

## 2023-08-30 DIAGNOSIS — G4730 Sleep apnea, unspecified: Secondary | ICD-10-CM | POA: Insufficient documentation

## 2023-09-06 ENCOUNTER — Telehealth: Payer: Self-pay | Admitting: Cardiology

## 2023-09-06 NOTE — Telephone Encounter (Signed)
Patient called to follow-up on sleep study results.

## 2023-09-07 NOTE — Telephone Encounter (Signed)
 Please review and call pt with update

## 2023-09-13 ENCOUNTER — Other Ambulatory Visit (HOSPITAL_BASED_OUTPATIENT_CLINIC_OR_DEPARTMENT_OTHER): Payer: Self-pay | Admitting: Registered Nurse

## 2023-09-13 DIAGNOSIS — Z9189 Other specified personal risk factors, not elsewhere classified: Secondary | ICD-10-CM

## 2023-09-14 ENCOUNTER — Ambulatory Visit (HOSPITAL_BASED_OUTPATIENT_CLINIC_OR_DEPARTMENT_OTHER)
Admission: RE | Admit: 2023-09-14 | Discharge: 2023-09-14 | Disposition: A | Discharge status: Home / Self Care | Source: Ambulatory Visit | Attending: Registered Nurse | Admitting: Registered Nurse

## 2023-09-14 DIAGNOSIS — M8589 Other specified disorders of bone density and structure, multiple sites: Secondary | ICD-10-CM | POA: Diagnosis not present

## 2023-09-14 DIAGNOSIS — Z9189 Other specified personal risk factors, not elsewhere classified: Secondary | ICD-10-CM | POA: Diagnosis present

## 2023-09-14 DIAGNOSIS — Z13820 Encounter for screening for osteoporosis: Secondary | ICD-10-CM | POA: Insufficient documentation

## 2023-09-18 ENCOUNTER — Encounter (HOSPITAL_BASED_OUTPATIENT_CLINIC_OR_DEPARTMENT_OTHER): Payer: Self-pay | Admitting: Cardiovascular Disease

## 2023-09-18 NOTE — Procedures (Signed)
 [facility] [address] [place], [place] [address] Tel: [phone]   Fax: [phone]  Home Sleep Test Interpretation  Patient Name: Stephanie Wall, Stephanie Wall Date: 08/30/2023  Date of Birth: 1955-06-17 Study Type: HST  Age: 68 year MRN #: [id number]  Sex: Female Interpreting Physician: Magnus Schuller [id number]  Height: 5\' 6"  Referring Physician: Dr. Magnus Schuller  Weight: 234.0 lbs Recording Tech: Alvah Auerbach RRT RPSGT RST  BMI: 38.1 Scoring Tech: Alvah Auerbach RRT RPSGT RST  ESS: 2 Neck Size: 14   Indications for Polysomnography The patient is a 68 year old Female who is 5\' 6"  and weighs 234.0 lbs. Her BMI equals 38.1.  A home sleep apnea test was performed to evaluate for -.  Medication  No Data.   Polysomnogram Data A home sleep test recorded the standard physiologic parameters including EKG, nasal and oral airflow.  Respiratory parameters of chest and abdominal movements were recorded with Respiratory Inductance Plethysmography belts.  Oxygen saturation was recorded by pulse oximetry.   Study Architecture The total recording time of the polysomnogram was 407.2 minutes.  The total monitoring time was 408.0 minutes.  Time spent in Supine position was 0 minutes.   Respiratory Events The study revealed a presence of 8 obstructive, 3 central, and 0 mixed apneas resulting in an Apnea index of 1.6 events per hour.  There were 14 hypopneas (>=3% desaturation and/or arousal) resulting in an Apnea\Hypopnea Index (AHI >=3% desaturation and/or arousal) of 3.7 events per hour.  There were 12 hypopneas (>=4% desaturation) resulting in an Apnea\Hypopnea Index (AHI >=4% desaturation) of 3.4 events per hour.  There were - Respiratory Effort Related Arousals resulting in a RERA index of 0 events per hour. The Respiratory Disturbance Index is 3.7 events per hour.  The snore index was 0 events per hour.  Mean oxygen saturation was 96.2%.  The lowest  oxygen saturation during monitoring time was 55.0% (not accurate).  Time spent <=88% oxygen saturation was 2.4 minutes (0.6%).  Cardiac Summary The average pulse rate was 66.1 bpm.  The minimum pulse rate was 54.0 bpm while the maximum pulse rate was 89.0 bpm.  Cardiac rhythm was normal.  Diagnosis:  Mild increased upper airway resistance without obstructive sleep apnea ; AHI 3.7/h (3%) and 3.7% (4%) ; however, supine sleep was absent on this study which may have contributed to reduced events.  Recommendations: At present there is no indication for CPAP initiation. Effort should be made to optimize nasal and oropharyngeal patency.  Weight loss and exercise is recommended. If patient has recurrent symptomatology consider a future study with supine sleep.   This study was personally reviewed and electronically signed by: Dr. Magnus Schuller Accredited Board Certified in Sleep Medicine Date/Time: Sep 18, 2023/12:14 pm   Study Overview  Recording Time: 506.0 min. Monitoring Time: 408.0 min.  Analysis Start:  10:31:13 PM Supine Time: - min.  Analysis Stop:  05:18:23 AM     Study Summary   Count Index Longest Event Duration  Apneas & Hypopneas: 25 3.7  Apneas: 23.8 sec.     Hypopneas: 67.4 sec.  RERAs: - - - sec.  Desaturations: 19 2.8 72.1 sec.  Snores: - - - sec.    Minimum Oxygen Saturation: 55.0%    Respiratory Summary   Total Duration Supine Non-Supine   Count Index Average Longest Count Index Count Index  Obstructive Apnea 8 1.2 14.7 23.8 - - 8 1.2   Mixed Apnea - - - - - - - -  Central Apnea 3 0.4 14.7 17.3 - - 3 0.4   Total Apneas 11 1.6 14.7 23.8 - - 11 1.6            Hypopneas 3% 14 2.1 N.A. N.A. - - 14 2.1   Apneas & Hyp. 3% 25 3.7 N.A. N.A. - - 25 3.7            Hypopneas 4% 12 1.8 N.A. N.A. - - 12 1.8  Apneas & Hyp. 4% 23 3.4 N.A. N.A. - - 23 3.4             RERAs - - - - - - - -  RDI 25 3.7 N.A. N.A. - - 25 3.7   Oxygen Saturation Summary   Total Supine  Non-Supine  Average SpO2 96.2% - 96.2%  Minimum SpO2 55.0% - 55.0%   Maximum SpO2 100.0% - 100.0%   Oxygen Saturation Distribution  Range (%) Time in range (min) Time in range (%)  90.0 - 100.0 398.7 99.1%  80.0 - 90.0 2.7 0.7%  70.0 - 80.0 0.8 0.2%  60.0 - 70.0 - -  50.0 - 60.0 0.2 0.1%  0.0 - 50.0 - -  Time Spent <=88% SpO2  Range (%) Time in range (min) Time in range (%)  0.0 - 88.0 2.4 0.6%   Cardiac Summary   Total Supine Non-Supine  Average Pulse Rate (BPM) 66.1 - 66.1  Minimum Pulse Rate (BPM) 54.0 - 54.0  Maximum Pulse Rate (BPM) 89.0 - 89.0                                         Magnus Schuller Diplomate, Biomedical engineer of Sleep Medicine  ELECTRONICALLY SIGNED ON:  09/18/2023, 12:15 PM [facility] PH: [phone]   FX: [phone] ACCREDITED BY THE [employer]ICAN ACADEMY OF SLEEP MEDICINE

## 2023-09-26 NOTE — Telephone Encounter (Signed)
 The patient has been notified of the result via her phone and mychart.

## 2023-09-26 NOTE — Telephone Encounter (Addendum)
 The patient has been notified of the result and verbalized understanding.  All questions (if any) were answered. Gaylene Kays, CMA 09/26/2023 2:20 PM     Pt is agreeable to normal results.

## 2023-10-30 ENCOUNTER — Telehealth: Payer: Self-pay

## 2023-10-30 NOTE — Telephone Encounter (Signed)
 Brad contacted 6/3 with sleep study results. No CPAP needed.

## 2023-10-30 NOTE — Telephone Encounter (Signed)
-----   Message from Debby Sor sent at 09/18/2023  1:02 PM EDT ----- Connell, please notify pt of the results of HST.

## 2023-12-27 NOTE — Progress Notes (Signed)
 Cardiology Office Note   Date:  12/28/2023  ID:  Stephanie Wall, DOB 11-27-1955, MRN [id number] PCP: Stephanie Harlene CROME, NP  [facility] Providers Cardiologist:  Stephanie Bruckner, MD     [facility] Palpitations Atypical chest pain GERD Obesity  She established with Dr. Bruckner 02/2021 for evaluation of palpitations.  She had noted palpitations since having COVID infection August 2022.  She felt fluttering and noted faster heart rate, and sharp stabbing chest pains.  The rapid heartbeat most often occurred after eating.  She generally has just 1 cup of coffee a day and does not drink alcohol.  She had gone caffeine free for the previous 7 days and had not noted much difference.  She occasionally felt lightheaded and had noted some bilateral LE edema that dissipates by morning.  Symptoms had improved so it was felt cardiac monitor would be low yield.  Cardiac risk reduction was emphasized and she was advised to follow-up as needed.  Seen by Stephanie Finder, NP, on 11/14/2022 for follow-up of echocardiogram ordered by PCP due to swelling and was told her heart was slightly enlarged in the right ventricle.  She reported constant leg swelling.  She noted her heart speeding up 2-3 times per month associated with lightheadedness but only lasting a few minutes.  EKG revealed NSR at 60 bpm with incomplete right bundle branch block.  Home sleep study was ordered for STOP-BANG score of 6.  Echo report from [facility] was obtained and revealed mildly dilated RV with mildly reduced function, normal LVEF.  She was advised to start Lasix  3 times per week.  BNP was not elevated.  Last cardiology clinic visit was 06/26/2023 with Stephanie Finder, NP.  Unfortunately her father had passed away from esophageal cancer a few months prior.  She self discontinued Lasix  as she perceived it was causing palpitations.  Since stopping Lasix , LE edema worsened.  She was advised to take metoprolol  tartrate 25 mg  as needed for palpitations.  Use compression and leg elevation for edema.  Sleep test revealed no sleep apnea, no need for CPAP 10/2023.   History of Present Illness Discussed the use of AI scribe software for clinical note transcription with the patient, who gave verbal consent to proceed.  History of Present Illness Stephanie Wall is a very pleasant 68 year old female who presents for cardiovascular follow-up. Reports she has been very busy caring for her mother who has dementia.  No recent episodes of heart palpitations.  She denies chest pain, shortness of breath, orthopnea, PND. No issues with blood pressure although she has not been monitoring consistently.  She continues to have chronic leg edema that she does not feel has changed recently. She feels well overall. She takes furosemide  20 mg on Monday and Thursday. She does experience shortness of breath when walking longer distances than usual, without associated chest pain. She experiences occasional dizziness and lightheadedness, sometimes when sitting, which she attributes to stress and possibly dehydration.  No presyncope or syncope. She acknowledges not drinking enough water lately due to her busy schedule and experienced leg cramps after a family reunion where she was very active and did not drink enough water.  Lipid levels from PCP collected 09/13/2023 revealed total cholesterol 164, HDL 66, triglycerides 82, and LDL 84.  Is not on lipid-lowering therapy.    ROS: See HPI  Studies Reviewed EKG Interpretation Date/Time:  Thursday December 28 2023 10:54:04 EDT Ventricular Rate:  60 PR Interval:  184 QRS  Duration:  98 QT Interval:  424 QTC Calculation: 424 R Axis:   -26  Text Interpretation: Normal sinus rhythm Moderate voltage criteria for LVH, may be normal variant ( R in aVL , Cornell product ) Nonspecific T wave abnormality When compared with ECG of 14-Nov-2022 08:27, Nonspecific T wave abnormality no longer evident in  Lateral leads Confirmed by Stephanie Wall [phone]) on 12/28/2023 11:02:40 AM     No results found for: LIPOA  Risk Assessment/Calculations       STOP-Bang Score:         Physical Exam VS:  BP 108/70 (BP Location: Left Arm, Patient Position: Sitting, Cuff Size: Large)   Pulse 60   Ht 5' 6 (1.676 m)   Wt 231 lb (104.8 kg)   SpO2 99%   BMI 37.28 kg/m    Wt Readings from Last 3 Encounters:  12/28/23 231 lb (104.8 kg)  08/30/23 230 lb (104.3 kg)  06/26/23 234 lb 6.4 oz (106.3 kg)    GEN: Well nourished, well developed in no acute distress NECK: No JVD; No carotid bruits CARDIAC: RRR, no murmurs, rubs, gallops RESPIRATORY:  Clear to auscultation without rales, wheezing or rhonchi  ABDOMEN: Soft, non-tender, non-distended EXTREMITIES:  No edema; No deformity   Assessment & Plan Palpitations  She reports palpitations are well-controlled at this time.  Admits that she is busier and often does not notice them if they are occurring.  She does not take metoprolol . -Continue to monitor clinically at this time  Lower extremity edema Exertional dyspnea RV enlargement Chronic bilateral LE edema that she feels has not worsened recently.  She is taking Lasix  20 mg 2 days/week.  She does not notice a significant difference on these days but plans to continue to take it.  Renal function stable on labs completed 09/13/2023.  She denies shortness of breath, orthopnea, PND.  Echo 05/10/2022 revealed LVEF 55 to 60%, indeterminate diastolic pattern, mildly dilated RV with mildly reduced function. Exertional dyspnea persists but has not worsened. No chest pain.  She is able to complete her regular activities without significant dyspnea. Blood pressure is well-controlled. No evidence of volume overload on exam.  -Continue furosemide  20 mg twice weekly  Dehydration  Fluid intake is low due to a busy schedule, causing dizziness and leg cramps. She acknowledges the need for hydration and is considering  reminders to increase intake.  -Encouraged increased oral fluid intake, aiming for at least 64 ounces daily -Consider using a reminder app or setting reminders to drink water regularly.  Cardiac risk EKG today reveals NSR, nonspecific T wave abnormality.  No prior ischemia evaluation.She denies chest pain, dyspnea, or other symptoms concerning for angina.  No indication for further ischemic evaluation at this time.  Lipids from 08/2023 reveal LDL 84, triglycerides 62. -Aim to increase physical activity -Eat a heart healthy mostly whole food diet avoiding saturated fat, processed foods, sugar, and other simple carbohydrates        Dispo: 1 year with Dr. Lonni Espy  Signed, Wall Percy, NP-C

## 2023-12-28 ENCOUNTER — Ambulatory Visit (HOSPITAL_BASED_OUTPATIENT_CLINIC_OR_DEPARTMENT_OTHER): Admitting: Nurse Practitioner

## 2023-12-28 ENCOUNTER — Encounter (HOSPITAL_BASED_OUTPATIENT_CLINIC_OR_DEPARTMENT_OTHER): Payer: Self-pay | Admitting: Nurse Practitioner

## 2023-12-28 VITALS — BP 108/70 | HR 60 | Ht 66.0 in | Wt 231.0 lb

## 2023-12-28 DIAGNOSIS — R002 Palpitations: Secondary | ICD-10-CM | POA: Diagnosis not present

## 2023-12-28 DIAGNOSIS — R0609 Other forms of dyspnea: Secondary | ICD-10-CM

## 2023-12-28 DIAGNOSIS — E860 Dehydration: Secondary | ICD-10-CM | POA: Diagnosis not present

## 2023-12-28 DIAGNOSIS — R600 Localized edema: Secondary | ICD-10-CM

## 2023-12-28 DIAGNOSIS — I517 Cardiomegaly: Secondary | ICD-10-CM | POA: Diagnosis not present

## 2023-12-28 NOTE — Patient Instructions (Signed)
 Medication Instructions:  Your physician recommends that you continue on your current medications as directed. Please refer to the Current Medication list given to you today.  *If you need a refill on your cardiac medications before your next appointment, please call your pharmacy*  Lab Work: NONE  Testing/Procedures: NONE  Follow-Up: At [facility], you and your health needs are our priority.  As part of our continuing mission to provide you with exceptional heart care, we have created designated Provider Care Teams.  These Care Teams include your primary Cardiologist (physician) and Advanced Practice Providers (APPs -  Physician Assistants and Nurse Practitioners) who all work together to provide you with the care you need, when you need it.  We recommend signing up for the patient portal called MyChart.  Sign up information is provided on this After Visit Summary.  MyChart is used to connect with patients for Virtual Visits (Telemedicine).  Patients are able to view lab/test results, encounter notes, upcoming appointments, etc.  Non-urgent messages can be sent to your provider as well.   To learn more about what you can do with MyChart, go to [email or web address].    Your next appointment:   12 month(s)  The format for your next appointment:   In Person  Provider:  ROSALINE RAMAN NP, RECHE ORN NP, OR DR LONNI

## 2024-02-16 ENCOUNTER — Other Ambulatory Visit: Payer: Self-pay | Admitting: Student

## 2024-02-16 DIAGNOSIS — Z1231 Encounter for screening mammogram for malignant neoplasm of breast: Secondary | ICD-10-CM

## 2024-03-25 ENCOUNTER — Ambulatory Visit
Admission: RE | Admit: 2024-03-25 | Discharge: 2024-03-25 | Disposition: A | Source: Ambulatory Visit | Attending: Student

## 2024-03-25 DIAGNOSIS — Z1231 Encounter for screening mammogram for malignant neoplasm of breast: Secondary | ICD-10-CM
